# Patient Record
Sex: Female | Born: 2000 | Hispanic: No | Marital: Single | Smoking: Never smoker
Health system: Southern US, Community
[De-identification: ages and names within clinical notes are randomized; demographics above are authoritative.]

## PROBLEM LIST (undated history)

## (undated) DIAGNOSIS — J45909 Unspecified asthma, uncomplicated: Secondary | ICD-10-CM

## (undated) DIAGNOSIS — F909 Attention-deficit hyperactivity disorder, unspecified type: Secondary | ICD-10-CM

## (undated) DIAGNOSIS — R739 Hyperglycemia, unspecified: Secondary | ICD-10-CM

## (undated) HISTORY — DX: Hyperglycemia, unspecified: R73.9

## (undated) HISTORY — PX: WISDOM TOOTH EXTRACTION: SHX21

---

## 2021-12-04 DIAGNOSIS — L749 Eccrine sweat disorder, unspecified: Secondary | ICD-10-CM | POA: Diagnosis not present

## 2021-12-04 DIAGNOSIS — K760 Fatty (change of) liver, not elsewhere classified: Secondary | ICD-10-CM | POA: Diagnosis not present

## 2021-12-04 DIAGNOSIS — R531 Weakness: Secondary | ICD-10-CM | POA: Diagnosis not present

## 2021-12-04 DIAGNOSIS — R197 Diarrhea, unspecified: Secondary | ICD-10-CM | POA: Diagnosis not present

## 2021-12-04 DIAGNOSIS — R3915 Urgency of urination: Secondary | ICD-10-CM | POA: Diagnosis not present

## 2021-12-07 DIAGNOSIS — R002 Palpitations: Secondary | ICD-10-CM | POA: Diagnosis not present

## 2021-12-21 DIAGNOSIS — L509 Urticaria, unspecified: Secondary | ICD-10-CM | POA: Diagnosis not present

## 2022-02-26 DIAGNOSIS — F909 Attention-deficit hyperactivity disorder, unspecified type: Secondary | ICD-10-CM | POA: Diagnosis not present

## 2022-02-26 DIAGNOSIS — R1030 Lower abdominal pain, unspecified: Secondary | ICD-10-CM | POA: Diagnosis not present

## 2022-02-26 DIAGNOSIS — Z789 Other specified health status: Secondary | ICD-10-CM | POA: Diagnosis not present

## 2022-02-26 DIAGNOSIS — R197 Diarrhea, unspecified: Secondary | ICD-10-CM | POA: Diagnosis not present

## 2022-02-26 DIAGNOSIS — K760 Fatty (change of) liver, not elsewhere classified: Secondary | ICD-10-CM | POA: Diagnosis not present

## 2022-04-26 DIAGNOSIS — F4322 Adjustment disorder with anxiety: Secondary | ICD-10-CM | POA: Diagnosis not present

## 2022-05-02 DIAGNOSIS — J387 Other diseases of larynx: Secondary | ICD-10-CM | POA: Diagnosis not present

## 2022-05-02 DIAGNOSIS — L501 Idiopathic urticaria: Secondary | ICD-10-CM | POA: Diagnosis not present

## 2022-05-02 DIAGNOSIS — J309 Allergic rhinitis, unspecified: Secondary | ICD-10-CM | POA: Diagnosis not present

## 2022-05-02 DIAGNOSIS — T781XXA Other adverse food reactions, not elsewhere classified, initial encounter: Secondary | ICD-10-CM | POA: Diagnosis not present

## 2022-05-07 DIAGNOSIS — F4322 Adjustment disorder with anxiety: Secondary | ICD-10-CM | POA: Diagnosis not present

## 2022-05-13 ENCOUNTER — Other Ambulatory Visit: Payer: Self-pay

## 2022-05-13 ENCOUNTER — Emergency Department (HOSPITAL_COMMUNITY): Payer: Medicaid Other

## 2022-05-13 ENCOUNTER — Encounter (HOSPITAL_COMMUNITY): Payer: Self-pay | Admitting: Emergency Medicine

## 2022-05-13 ENCOUNTER — Emergency Department (HOSPITAL_COMMUNITY)
Admission: EM | Admit: 2022-05-13 | Discharge: 2022-05-13 | Disposition: A | Discharge status: Home / Self Care | Payer: Medicaid Other | Attending: Emergency Medicine | Admitting: Emergency Medicine

## 2022-05-13 DIAGNOSIS — R1031 Right lower quadrant pain: Secondary | ICD-10-CM | POA: Diagnosis not present

## 2022-05-13 DIAGNOSIS — R110 Nausea: Secondary | ICD-10-CM | POA: Insufficient documentation

## 2022-05-13 DIAGNOSIS — N9489 Other specified conditions associated with female genital organs and menstrual cycle: Secondary | ICD-10-CM | POA: Diagnosis not present

## 2022-05-13 HISTORY — DX: Attention-deficit hyperactivity disorder, unspecified type: F90.9

## 2022-05-13 LAB — COMPREHENSIVE METABOLIC PANEL
ALT: 14 U/L (ref 0–44)
AST: 18 U/L (ref 15–41)
Albumin: 3.7 g/dL (ref 3.5–5.0)
Alkaline Phosphatase: 34 U/L — ABNORMAL LOW (ref 38–126)
Anion gap: 6 (ref 5–15)
BUN: 9 mg/dL (ref 6–20)
CO2: 23 mmol/L (ref 22–32)
Calcium: 8.8 mg/dL — ABNORMAL LOW (ref 8.9–10.3)
Chloride: 110 mmol/L (ref 98–111)
Creatinine, Ser: 0.74 mg/dL (ref 0.44–1.00)
GFR, Estimated: >60 mL/min (ref >60)
Glucose, Bld: 119 mg/dL — ABNORMAL HIGH (ref 70–99)
Potassium: 4 mmol/L (ref 3.5–5.1)
Sodium: 139 mmol/L (ref 135–145)
Total Bilirubin: 1.1 mg/dL (ref 0.3–1.2)
Total Protein: 6.7 g/dL (ref 6.5–8.1)

## 2022-05-13 LAB — I-STAT BETA HCG BLOOD, ED (MC, WL, AP ONLY): I-stat hCG, quantitative: <5 m[IU]/mL (ref <5)

## 2022-05-13 LAB — CBC
HCT: 40.2 % (ref 36.0–46.0)
Hemoglobin: 14 g/dL (ref 12.0–15.0)
MCH: 26.9 pg (ref 26.0–34.0)
MCHC: 34.8 g/dL (ref 30.0–36.0)
MCV: 77.2 fL — ABNORMAL LOW (ref 80.0–100.0)
Platelets: 208 10*3/uL (ref 150–400)
RBC: 5.21 MIL/uL — ABNORMAL HIGH (ref 3.87–5.11)
RDW: 13 % (ref 11.5–15.5)
WBC: 8.8 10*3/uL (ref 4.0–10.5)
nRBC: 0 % (ref 0.0–0.2)

## 2022-05-13 LAB — LIPASE, BLOOD: Lipase: 22 U/L (ref 11–51)

## 2022-05-13 MED ORDER — MORPHINE SULFATE (PF) 4 MG/ML IV SOLN
4.0000 mg | Freq: Once | INTRAVENOUS | Status: AC
  Administered 2022-05-13: 4 mg via INTRAVENOUS
  Filled 2022-05-13: qty 1

## 2022-05-13 MED ORDER — IOHEXOL 350 MG/ML SOLN
100.0000 mL | Freq: Once | INTRAVENOUS | Status: AC | PRN
  Administered 2022-05-13: 100 mL via INTRAVENOUS

## 2022-05-13 MED ORDER — ONDANSETRON HCL 4 MG/2ML IJ SOLN
4.0000 mg | Freq: Once | INTRAMUSCULAR | Status: AC
Start: 2022-05-13 — End: 2022-05-13
  Administered 2022-05-13: 4 mg via INTRAVENOUS
  Filled 2022-05-13: qty 2

## 2022-05-13 MED ORDER — SODIUM CHLORIDE 0.9 % IV BOLUS
1000.0000 mL | Freq: Once | INTRAVENOUS | Status: AC
  Administered 2022-05-13: 1000 mL via INTRAVENOUS

## 2022-05-13 NOTE — ED Provider Notes (Signed)
[facility] EMERGENCY DEPARTMENT Provider Note   CSN: [id number] Arrival date & time: 05/13/22  1319     History  Chief Complaint  Patient presents with   Abdominal Pain    Stephanie Huff is a 21 y.o. female.  21 year old female with past medical history of ADHD presents with complaint of periumbilical abdominal pain which radiates towards her right lower quadrant associated with nausea.  Patient reports periumbilical abdominal pain yesterday but did not think much of it until today after eating lunch she was laughing and developed sudden significant pain onset 20 minutes prior to arrival.  Denies changes in bowel or bladder habits, fevers, chills, changes in appetite.  No prior abdominal surgeries.  No other complaints or concerns.      Home Medications Prior to Admission medications   Not on File      Allergies    Patient has no allergy information on record.    Review of Systems   Review of Systems Negative except as per HPI Physical Exam Updated Vital Signs BP 132/87 (BP Location: Left Arm)   Pulse 96   Temp 98 F (36.7 C) (Oral)   Resp 20   LMP 04/25/2022   SpO2 100%  Physical Exam Vitals and nursing note reviewed.  Constitutional:      General: She is not in acute distress.    Appearance: She is well-developed. She is obese. She is not diaphoretic.  HENT:     Head: Normocephalic and atraumatic.  Cardiovascular:     Rate and Rhythm: Normal rate and regular rhythm.     Heart sounds: Normal heart sounds.  Pulmonary:     Effort: Pulmonary effort is normal.     Breath sounds: Normal breath sounds.  Abdominal:     Palpations: Abdomen is soft.     Tenderness: There is abdominal tenderness. There is guarding. There is no rebound.  Skin:    General: Skin is warm and dry.     Findings: No erythema or rash.  Neurological:     Mental Status: She is alert and oriented to person, place, and time.  Psychiatric:        Behavior: Behavior  normal.    ED Results / Procedures / Treatments   Labs (all labs ordered are listed, but only abnormal results are displayed) Labs Reviewed  COMPREHENSIVE METABOLIC PANEL - Abnormal; Notable for the following components:      Result Value   Glucose, Bld 119 (*)    Calcium 8.8 (*)    Alkaline Phosphatase 34 (*)    All other components within normal limits  CBC - Abnormal; Notable for the following components:   RBC 5.21 (*)    MCV 77.2 (*)    All other components within normal limits  LIPASE, BLOOD  URINALYSIS, ROUTINE W REFLEX MICROSCOPIC  I-STAT BETA HCG BLOOD, ED ([facility], [facility], [facility] ONLY)    EKG None  Radiology CT Abdomen Pelvis W Contrast  Result Date: 05/13/2022 CLINICAL DATA:  Right lower quadrant abdominal pain EXAM: CT ABDOMEN AND PELVIS WITH CONTRAST TECHNIQUE: Multidetector CT imaging of the abdomen and pelvis was performed using the standard protocol following bolus administration of intravenous contrast. RADIATION DOSE REDUCTION: This exam was performed according to the departmental dose-optimization program which includes automated exposure control, adjustment of the mA and/or kV according to patient size and/or use of iterative reconstruction technique. CONTRAST:  110mL OMNIPAQUE IOHEXOL 350 MG/ML SOLN COMPARISON:  None Available. FINDINGS: Lower chest: No acute abnormality.  Hepatobiliary: No solid liver abnormality is seen. No gallstones, gallbladder wall thickening, or biliary dilatation. Pancreas: Unremarkable. No pancreatic ductal dilatation or surrounding inflammatory changes. Spleen: Normal in size without significant abnormality. Adrenals/Urinary Tract: Adrenal glands are unremarkable. Kidneys are normal, without renal calculi, solid lesion, or hydronephrosis. Bladder is unremarkable. Stomach/Bowel: Stomach is within normal limits. Appendix appears normal. No evidence of bowel wall thickening, distention, or inflammatory changes. Vascular/Lymphatic: No significant vascular  findings are present. No enlarged abdominal or pelvic lymph nodes. Reproductive: No mass. Faintly rim hyperdense lesion of the right ovary measuring 1.5 x 1.4 cm (series 3, image 85). Other: No abdominal wall hernia or abnormality. No ascites. Musculoskeletal: No acute or significant osseous findings. IMPRESSION: 1. No definite CT findings of the abdomen or pelvis to explain right lower quadrant pain. 2. Normal appendix. 3. Faintly rim hyperdense lesion of the right ovary measuring 1.5 x 1.4 cm, most likely a corpus luteum or a small hemorrhagic cyst. No follow-up imaging recommended. Note: This recommendation does not apply to premenarchal patients and to those with increased risk (genetic, family history, elevated tumor markers or other high-risk factors) of ovarian cancer. Reference: JACR 2020 Feb; 17(2):248-254 Electronically Signed   By: Delanna Ahmadi M.D.   On: 05/13/2022 15:55    Procedures Procedures    Medications Ordered in ED Medications  sodium chloride 0.9 % bolus 1,000 mL (1,000 mLs Intravenous New Bag/Given 05/13/22 1358)  ondansetron (ZOFRAN) injection 4 mg (4 mg Intravenous Given 05/13/22 1359)  morphine (PF) 4 MG/ML injection 4 mg (4 mg Intravenous Given 05/13/22 1359)  iohexol (OMNIPAQUE) 350 MG/ML injection 100 mL (100 mLs Intravenous Contrast Given 05/13/22 1550)    ED Course/ Medical Decision Making/ A&P                           Medical Decision Making Amount and/or Complexity of Data Reviewed Labs: ordered. Radiology: ordered.  Risk Prescription drug management.   This patient presents to the ED for concern of periumbilical to right lower quadrant pain, this involves an extensive number of treatment options, and is a complaint that carries with it a high risk of complications and morbidity.  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  The pertinent results include: CBC C with normal white blood cell count, not anemic.  His CMP with mildly elevated glucose on this nonfasting sample at 119 with normal renal and hepatic function.  Lipase normal, hCG negative.   Imaging Studies ordered:  I ordered imaging studies including CT abdomen pelvis I independently visualized and interpreted imaging which showed normal appendix, otherwise unremarkable exam I agree with the radiologist interpretation  Problem List / ED Course / Critical interventions / Medication management  21 year old female with periumbilical abdominal pain onset yesterday, continuing today now with right lower quadrant pain, worse after laughing and eating.  She is found to have right lower quadrant and diffuse abdominal tenderness with guarding.  She appears uncomfortable.  Labs were assessed and are generally reassuring.  CT abdomen pelvis was ordered with concern for  appendicitis given her periumbilical abdominal pain onset yesterday which now localizes to the right lower quadrant. I ordered medication including morphine, Zofran for pain Reevaluation of the patient after these medicines showed that the patient improved I have reviewed the patients home medicines and have made adjustments as needed   Social Determinants of Health:  Has PCP for follow up   Test / Admission - Considered:  Recommend repeat exam in 24 hours with PCP if continuing to have abdominal pain.  Discussed presentation early in her pain could limit her findings today.         Final Clinical Impression(s) / ED  Diagnoses Final diagnoses:  Right lower quadrant abdominal pain    Rx / DC Orders ED Discharge Orders     None         Tacy Learn, PA-C 05/13/22 1604    Horton, Alvin Critchley, DO 05/13/22 1814

## 2022-05-13 NOTE — Discharge Instructions (Signed)
Recommend recheck with your primary care provider in the next 24 hours for repeat abdominal exam.  If your symptoms progress, return to the emergency room for further evaluation.

## 2022-05-13 NOTE — ED Triage Notes (Signed)
Pt states she was eating and then started laughing just prior to arrival and had sudden RLQ pain with nausea.

## 2022-05-18 DIAGNOSIS — F4322 Adjustment disorder with anxiety: Secondary | ICD-10-CM | POA: Diagnosis not present

## 2022-06-04 DIAGNOSIS — F900 Attention-deficit hyperactivity disorder, predominantly inattentive type: Secondary | ICD-10-CM | POA: Diagnosis not present

## 2022-06-04 DIAGNOSIS — R195 Other fecal abnormalities: Secondary | ICD-10-CM | POA: Diagnosis not present

## 2022-06-04 DIAGNOSIS — R197 Diarrhea, unspecified: Secondary | ICD-10-CM | POA: Diagnosis not present

## 2022-06-04 DIAGNOSIS — R202 Paresthesia of skin: Secondary | ICD-10-CM | POA: Diagnosis not present

## 2022-06-04 DIAGNOSIS — G514 Facial myokymia: Secondary | ICD-10-CM | POA: Diagnosis not present

## 2022-06-04 DIAGNOSIS — R1031 Right lower quadrant pain: Secondary | ICD-10-CM | POA: Diagnosis not present

## 2022-06-06 DIAGNOSIS — F4322 Adjustment disorder with anxiety: Secondary | ICD-10-CM | POA: Diagnosis not present

## 2022-06-08 DIAGNOSIS — H5213 Myopia, bilateral: Secondary | ICD-10-CM | POA: Diagnosis not present

## 2022-06-14 DIAGNOSIS — H5213 Myopia, bilateral: Secondary | ICD-10-CM | POA: Diagnosis not present

## 2022-07-10 DIAGNOSIS — F1210 Cannabis abuse, uncomplicated: Secondary | ICD-10-CM | POA: Diagnosis not present

## 2022-07-20 DIAGNOSIS — F4322 Adjustment disorder with anxiety: Secondary | ICD-10-CM | POA: Diagnosis not present

## 2022-08-01 DIAGNOSIS — G479 Sleep disorder, unspecified: Secondary | ICD-10-CM | POA: Diagnosis not present

## 2022-08-01 DIAGNOSIS — G514 Facial myokymia: Secondary | ICD-10-CM | POA: Diagnosis not present

## 2022-08-01 DIAGNOSIS — R202 Paresthesia of skin: Secondary | ICD-10-CM | POA: Diagnosis not present

## 2022-08-10 DIAGNOSIS — G514 Facial myokymia: Secondary | ICD-10-CM | POA: Diagnosis not present

## 2022-08-10 DIAGNOSIS — A045 Campylobacter enteritis: Secondary | ICD-10-CM | POA: Diagnosis not present

## 2022-08-10 DIAGNOSIS — R1031 Right lower quadrant pain: Secondary | ICD-10-CM | POA: Diagnosis not present

## 2022-08-10 DIAGNOSIS — D1723 Benign lipomatous neoplasm of skin and subcutaneous tissue of right leg: Secondary | ICD-10-CM | POA: Diagnosis not present

## 2022-08-17 DIAGNOSIS — F4322 Adjustment disorder with anxiety: Secondary | ICD-10-CM | POA: Diagnosis not present

## 2022-08-20 ENCOUNTER — Telehealth: Payer: Medicaid Other | Admitting: Physician Assistant

## 2022-08-20 DIAGNOSIS — R197 Diarrhea, unspecified: Secondary | ICD-10-CM

## 2022-08-20 MED ORDER — ONDANSETRON HCL 4 MG PO TABS: 4.0000 mg | ORAL_TABLET | Freq: Three times a day (TID) | ORAL | 0 refills | Status: DC | PRN

## 2022-08-20 MED ORDER — CIPROFLOXACIN HCL 500 MG PO TABS: 500.0000 mg | ORAL_TABLET | Freq: Two times a day (BID) | ORAL | 0 refills | Status: AC

## 2022-08-20 NOTE — Progress Notes (Signed)
We are sorry that you are not feeling well.  Here is how we plan to help!  Based on what you have shared with me it looks like you have Acute Infectious Diarrhea.  Most cases of acute diarrhea are due to infections with virus and bacteria and are self-limited conditions lasting less than 14 days.  For your symptoms you may take Imodium 2 mg tablets that are over the counter at your local pharmacy. Take two tablet now and then one after each loose stool up to 6 a day.  Antibiotics are not needed for most people with diarrhea.  Optional: Zofran 4 mg 1 tablet every 8 hours as needed for nausea and vomiting  Optional: I have prescribed Cipro 500 mg twice a day for seven days  HOME CARE We recommend changing your diet to help with your symptoms for the next few days. Drink plenty of fluids that contain water salt and sugar. Sports drinks such as Gatorade may help.  You may try broths, soups, bananas, applesauce, soft breads, mashed potatoes or crackers.  You are considered infectious for as long as the diarrhea continues. Hand washing or use of alcohol based hand sanitizers is recommend. It is best to stay out of work or school until your symptoms stop.   GET HELP RIGHT AWAY If you have dark yellow colored urine or do not pass urine frequently you should drink more fluids.   If your symptoms worsen  If you feel like you are going to pass out (faint) You have a new problem  MAKE SURE YOU  Understand these instructions. Will watch your condition. Will get help right away if you are not doing well or get worse.  Thank you for choosing an e-visit.  Your e-visit answers were reviewed by a board certified advanced clinical practitioner to complete your personal care plan. Depending upon the condition, your plan could have included both over the counter or prescription medications.  Please review your pharmacy choice. Make sure the pharmacy is open so you can pick up prescription now. If there  is a problem, you may contact your provider through MyChart messaging and have the prescription routed to another pharmacy.  Your safety is important to us. If you have drug allergies check your prescription carefully.   For the next 24 hours you can use MyChart to ask questions about today's visit, request a non-urgent call back, or ask for a work or school excuse. You will get an email in the next two days asking about your experience. I hope that your e-visit has been valuable and will speed your recovery.  I provided 5 minutes of non face-to-face time during this encounter for chart review and documentation.   

## 2022-08-28 DIAGNOSIS — R062 Wheezing: Secondary | ICD-10-CM | POA: Diagnosis not present

## 2022-09-06 ENCOUNTER — Telehealth: Payer: Medicaid Other | Admitting: Physician Assistant

## 2022-09-06 DIAGNOSIS — J069 Acute upper respiratory infection, unspecified: Secondary | ICD-10-CM | POA: Diagnosis not present

## 2022-09-06 MED ORDER — FLUTICASONE PROPIONATE 50 MCG/ACT NA SUSP: 2.0000 | Freq: Every day | NASAL | 0 refills | Status: DC

## 2022-09-06 MED ORDER — BENZONATATE 100 MG PO CAPS: 100.0000 mg | ORAL_CAPSULE | Freq: Three times a day (TID) | ORAL | 0 refills | Status: DC | PRN

## 2022-09-06 NOTE — Progress Notes (Signed)

## 2022-09-19 DIAGNOSIS — F4322 Adjustment disorder with anxiety: Secondary | ICD-10-CM | POA: Diagnosis not present

## 2022-09-25 DIAGNOSIS — K219 Gastro-esophageal reflux disease without esophagitis: Secondary | ICD-10-CM | POA: Diagnosis not present

## 2022-09-25 DIAGNOSIS — F419 Anxiety disorder, unspecified: Secondary | ICD-10-CM | POA: Diagnosis not present

## 2022-09-25 DIAGNOSIS — F3281 Premenstrual dysphoric disorder: Secondary | ICD-10-CM | POA: Diagnosis not present

## 2022-09-25 DIAGNOSIS — N83201 Unspecified ovarian cyst, right side: Secondary | ICD-10-CM | POA: Diagnosis not present

## 2022-09-25 DIAGNOSIS — F1210 Cannabis abuse, uncomplicated: Secondary | ICD-10-CM | POA: Diagnosis not present

## 2022-09-25 DIAGNOSIS — R109 Unspecified abdominal pain: Secondary | ICD-10-CM | POA: Diagnosis not present

## 2022-09-25 DIAGNOSIS — R195 Other fecal abnormalities: Secondary | ICD-10-CM | POA: Diagnosis not present

## 2022-10-05 DIAGNOSIS — F1210 Cannabis abuse, uncomplicated: Secondary | ICD-10-CM | POA: Diagnosis not present

## 2022-10-09 DIAGNOSIS — N83201 Unspecified ovarian cyst, right side: Secondary | ICD-10-CM | POA: Diagnosis not present

## 2022-10-09 DIAGNOSIS — R1084 Generalized abdominal pain: Secondary | ICD-10-CM | POA: Diagnosis not present

## 2022-10-09 DIAGNOSIS — R195 Other fecal abnormalities: Secondary | ICD-10-CM | POA: Diagnosis not present

## 2022-10-10 ENCOUNTER — Encounter: Payer: Self-pay | Admitting: Emergency Medicine

## 2022-10-10 ENCOUNTER — Ambulatory Visit
Admission: EM | Admit: 2022-10-10 | Discharge: 2022-10-10 | Disposition: A | Discharge status: Home / Self Care | Payer: Medicaid Other | Attending: Family Medicine | Admitting: Family Medicine

## 2022-10-10 ENCOUNTER — Ambulatory Visit (INDEPENDENT_AMBULATORY_CARE_PROVIDER_SITE_OTHER): Payer: Medicaid Other

## 2022-10-10 DIAGNOSIS — S9031XA Contusion of right foot, initial encounter: Secondary | ICD-10-CM | POA: Diagnosis not present

## 2022-10-10 DIAGNOSIS — M79671 Pain in right foot: Secondary | ICD-10-CM

## 2022-10-10 HISTORY — DX: Unspecified asthma, uncomplicated: J45.909

## 2022-10-10 NOTE — ED Provider Notes (Signed)
[facility]    CSN: [id number] Arrival date & time: 10/10/22  1829      History   Chief Complaint Chief Complaint  Patient presents with   Foot Injury    Entered by patient    HPI Stephanie Huff is a 21 y.o. female.   Can fell from counter on right little toe today.      Past Medical History:  Diagnosis Date   ADHD    Asthma     There are no problems to display for this patient.   History reviewed. No pertinent surgical history.  OB History   No obstetric history on file.      Home Medications    Prior to Admission medications   Medication Sig Start Date End Date Taking? Authorizing Provider  benzonatate (TESSALON) 100 MG capsule Take 1 capsule (100 mg total) by mouth 3 (three) times daily as needed for cough. 09/06/22   Waldon Merl, PA-C  fluticasone (FLONASE) 50 MCG/ACT nasal spray Place 2 sprays into both nostrils daily. 09/06/22   Waldon Merl, PA-C  ondansetron (ZOFRAN) 4 MG tablet Take 1 tablet (4 mg total) by mouth every 8 (eight) hours as needed for nausea or vomiting. 08/20/22   Margaretann Loveless, PA-C    Family History History reviewed. No pertinent family history.  Social History Social History   Tobacco Use   Smoking status: Never   Smokeless tobacco: Never  Substance Use Topics   Alcohol use: Not Currently   Drug use: Not Currently     Allergies   Patient has no known allergies.   Review of Systems Review of Systems PER HPI  Physical Exam Triage Vital Signs ED Triage Vitals  Enc Vitals Group     BP 10/10/22 1836 122/86     Pulse Rate 10/10/22 1836 95     Resp 10/10/22 1836 18     Temp 10/10/22 1836 98.1 F (36.7 C)     Temp Source 10/10/22 1836 Oral     SpO2 10/10/22 1836 98 %     Weight --      Height --      Head Circumference --      Peak Flow --      Pain Score 10/10/22 1837 7     Pain Loc --      Pain Edu? --      Excl. in GC? --    No data found.  Updated Vital Signs BP 122/86  (BP Location: Right Arm)   Pulse 95   Temp 98.1 F (36.7 C) (Oral)   Resp 18   LMP 10/05/2022 (Exact Date)   SpO2 98%   Visual Acuity Right Eye Distance:   Left Eye Distance:   Bilateral Distance:    Right Eye Near:   Left Eye Near:    Bilateral Near:     Physical Exam Vitals and nursing note reviewed.  Constitutional:      Appearance: Normal appearance. She is not ill-appearing.  HENT:     Head: Atraumatic.     Mouth/Throat:     Mouth: Mucous membranes are moist.  Eyes:     Extraocular Movements: Extraocular movements intact.     Conjunctiva/sclera: Conjunctivae normal.  Cardiovascular:     Rate and Rhythm: Normal rate.  Pulmonary:     Effort: Pulmonary effort is normal.  Musculoskeletal:        General: Swelling, tenderness and signs of injury present. No deformity. Normal range of  motion.     Cervical back: Normal range of motion and neck supple.  Skin:    General: Skin is warm and dry.     Findings: Erythema present.     Comments: Small area of erythema, edema at base of 5th toe right foot. Ttp in this area  Neurological:     Mental Status: She is alert and oriented to person, place, and time.     Comments: Right foot neurovascularly intact  Psychiatric:        Mood and Affect: Mood normal.        Thought Content: Thought content normal.        Judgment: Judgment normal.    UC Treatments / Results  Labs (all labs ordered are listed, but only abnormal results are displayed) Labs Reviewed - No data to display  EKG   Radiology DG Foot Complete Right  Result Date: 10/10/2022 CLINICAL DATA:  Can fell on right foot today. Third through fifth toe pain. EXAM: RIGHT FOOT COMPLETE - 3+ VIEW COMPARISON:  None Available. FINDINGS: Normal bone mineralization. Mild hallux valgus. No acute fracture is seen. No dislocation. IMPRESSION: Mild hallux valgus. No acute fracture. Electronically Signed   By: Yvonne Kendall M.D.   On: 10/10/2022 18:48     Procedures Procedures (including critical care time)  Medications Ordered in UC Medications - No data to display  Initial Impression / Assessment and Plan / UC Course  I have reviewed the triage vital signs and the nursing notes.  Pertinent labs & imaging results that were available during my care of the patient were reviewed by me and considered in my medical decision making (see chart for details).     X-ray of the right foot today showing no fractures or other acute abnormalities.  Discussed RICE protocol, over-the-counter pain relievers.  Patient requested work accommodations for the next 2 shifts which were provided in a note.  Final Clinical Impressions(s) / UC Diagnoses   Final diagnoses:  Contusion of right foot, initial encounter     Discharge Instructions      Your x-ray today did not show anything to be broken in your foot.  Make sure to use ice, elevate the leg at rest, take ibuprofen and Tylenol as needed for pain.    ED Prescriptions   None    PDMP not reviewed this encounter.   Volney American, [place] 10/10/22 1857

## 2022-10-10 NOTE — ED Triage Notes (Signed)
Can fell from counter on right little toe today.

## 2022-10-10 NOTE — Discharge Instructions (Signed)
Your x-ray today did not show anything to be broken in your foot.  Make sure to use ice, elevate the leg at rest, take ibuprofen and Tylenol as needed for pain.

## 2022-11-13 DIAGNOSIS — R202 Paresthesia of skin: Secondary | ICD-10-CM | POA: Diagnosis not present

## 2022-11-13 DIAGNOSIS — R29818 Other symptoms and signs involving the nervous system: Secondary | ICD-10-CM | POA: Diagnosis not present

## 2022-11-13 DIAGNOSIS — G478 Other sleep disorders: Secondary | ICD-10-CM | POA: Diagnosis not present

## 2022-11-13 DIAGNOSIS — G4763 Sleep related bruxism: Secondary | ICD-10-CM | POA: Diagnosis not present

## 2022-11-13 DIAGNOSIS — G4750 Parasomnia, unspecified: Secondary | ICD-10-CM | POA: Diagnosis not present

## 2022-11-13 DIAGNOSIS — F513 Sleepwalking [somnambulism]: Secondary | ICD-10-CM | POA: Diagnosis not present

## 2022-11-13 DIAGNOSIS — F458 Other somatoform disorders: Secondary | ICD-10-CM | POA: Diagnosis not present

## 2022-11-13 DIAGNOSIS — G4759 Other parasomnia: Secondary | ICD-10-CM | POA: Diagnosis not present

## 2022-11-23 ENCOUNTER — Ambulatory Visit
Admission: EM | Admit: 2022-11-23 | Discharge: 2022-11-23 | Disposition: A | Discharge status: Home / Self Care | Payer: Medicaid Other

## 2022-11-23 DIAGNOSIS — S91311A Laceration without foreign body, right foot, initial encounter: Secondary | ICD-10-CM | POA: Diagnosis not present

## 2022-11-23 MED ORDER — MUPIROCIN 2 % EX OINT: 1.0000 | TOPICAL_OINTMENT | Freq: Two times a day (BID) | CUTANEOUS | 0 refills | Status: DC

## 2022-11-23 MED ORDER — CHLORHEXIDINE GLUCONATE 4 % EX LIQD
Freq: Every day | CUTANEOUS | 0 refills | Status: DC | PRN
Start: 2022-11-23 — End: 2023-08-05

## 2022-11-23 NOTE — ED Triage Notes (Signed)
Pt reports laceration in the right foot with a metal part of a recliner last night.   Pt think she had the Tdap when she was 21 years old.

## 2022-11-23 NOTE — ED Provider Notes (Signed)
[facility]    CSN: [id number] Arrival date & time: 11/23/22  1018      History   Chief Complaint Chief Complaint  Patient presents with   Laceration    HPI Stephanie Huff is a 21 y.o. female.   Pt reports laceration in the right foot with a metal part of a recliner last night.    Pt think she had the Tdap when she was 21 years old.        Past Medical History:  Diagnosis Date   ADHD    Asthma     There are no problems to display for this patient.   History reviewed. No pertinent surgical history.  OB History   No obstetric history on file.      Home Medications    Prior to Admission medications   Medication Sig Start Date End Date Taking? Authorizing Provider  amphetamine-dextroamphetamine (ADDERALL XR) 30 MG 24 hr capsule Take by mouth. 11/01/22  Yes [provider]  chlorhexidine (HIBICLENS) 4 % external liquid Apply topically daily as needed. 11/23/22  Yes Particia Nearing, PA-C  mupirocin ointment (BACTROBAN) 2 % Apply 1 Application topically 2 (two) times daily. 11/23/22  Yes Particia Nearing, PA-C  benzonatate (TESSALON) 100 MG capsule Take 1 capsule (100 mg total) by mouth 3 (three) times daily as needed for cough. 09/06/22   Waldon Merl, PA-C  fluticasone (FLONASE) 50 MCG/ACT nasal spray Place 2 sprays into both nostrils daily. 09/06/22   Waldon Merl, PA-C  ondansetron (ZOFRAN) 4 MG tablet Take 1 tablet (4 mg total) by mouth every 8 (eight) hours as needed for nausea or vomiting. 08/20/22   Margaretann Loveless, PA-C    Family History History reviewed. No pertinent family history.  Social History Social History   Tobacco Use   Smoking status: Never   Smokeless tobacco: Never  Substance Use Topics   Alcohol use: Not Currently   Drug use: Not Currently   Allergies   Shellfish allergy and Black walnut flavor  Review of Systems Review of Systems PER HPI  Physical Exam Triage Vital Signs ED  Triage Vitals  Enc Vitals Group     BP 11/23/22 1110 (!) 127/93     Pulse Rate 11/23/22 1110 83     Resp 11/23/22 1110 16     Temp 11/23/22 1110 98 F (36.7 C)     Temp Source 11/23/22 1110 Oral     SpO2 11/23/22 1110 97 %     Weight --      Height --      Head Circumference --      Peak Flow --      Pain Score 11/23/22 1109 8     Pain Loc --      Pain Edu? --      Excl. in GC? --    No data found.  Updated Vital Signs BP (!) 127/93 (BP Location: Right Arm)   Pulse 83   Temp 98 F (36.7 C) (Oral)   Resp 16   LMP 11/01/2022 (Exact Date)   SpO2 97%   Visual Acuity Right Eye Distance:   Left Eye Distance:   Bilateral Distance:    Right Eye Near:   Left Eye Near:    Bilateral Near:     Physical Exam Vitals and nursing note reviewed.  Constitutional:      Appearance: Normal appearance. She is not ill-appearing.  HENT:     Head: Atraumatic.  Eyes:     Extraocular Movements: Extraocular movements intact.     Conjunctiva/sclera: Conjunctivae normal.  Cardiovascular:     Rate and Rhythm: Normal rate and regular rhythm.     Heart sounds: Normal heart sounds.  Pulmonary:     Effort: Pulmonary effort is normal.     Breath sounds: Normal breath sounds.  Musculoskeletal:        General: Tenderness and signs of injury present. No deformity. Normal range of motion.     Cervical back: Normal range of motion and neck supple.  Skin:    General: Skin is warm.     Comments: Well-approximated laceration/puncture type wound to the plantar surface of the right heel, no active bleeding or drainage, minimally tender to palpation.  No foreign body noted on exploration of wound  Neurological:     Mental Status: She is alert and oriented to person, place, and time.  Psychiatric:        Mood and Affect: Mood normal.        Thought Content: Thought content normal.        Judgment: Judgment normal.    UC Treatments / Results  Labs (all labs ordered are listed, but only abnormal  results are displayed) Labs Reviewed - No data to display  EKG  Radiology No results found.  Procedures Procedures (including critical care time)  Medications Ordered in UC Medications - No data to display  Initial Impression / Assessment and Plan / UC Course  I have reviewed the triage vital signs and the nursing notes.  Pertinent labs & imaging results that were available during my care of the patient were reviewed by me and considered in my medical decision making (see chart for details).     Wound cleaned, explored and no foreign bodies noted.  She states she is up-to-date on her tetanus shot as of 4 years ago.  New dressing applied, discussed home wound care with Hibiclens, mupirocin ointment and RICE protocol.  Light duty work note given per her request.  Return for worsening symptoms.  Final Clinical Impressions(s) / UC Diagnoses   Final diagnoses:  Laceration of right foot, initial encounter   Discharge Instructions   None    ED Prescriptions     Medication Sig Dispense Auth. Provider   chlorhexidine (HIBICLENS) 4 % external liquid Apply topically daily as needed. 120 mL Particia Nearing, PA-C   mupirocin ointment (BACTROBAN) 2 % Apply 1 Application topically 2 (two) times daily. 22 g Particia Nearing, [place]      PDMP not reviewed this encounter.   Particia Nearing, [place] 11/23/22 1206

## 2022-11-23 NOTE — ED Notes (Signed)
Called phone ad lobby, no answered.

## 2022-11-24 IMAGING — CT CT ABD-PELV W/ CM
2 of 4 series · 16 of 46 positions shown, 18 images · IV contrast (Omni 300)
Comparison: None Available.

CLINICAL DATA: Right lower quadrant abdominal pain

EXAM:
CT ABDOMEN AND PELVIS WITH CONTRAST
TECHNIQUE: Multidetector CT imaging of the abdomen and pelvis was performed
using the standard protocol following bolus administration of
intravenous contrast.

[Series 3: a/p w/ 5mm · axial · 0.98mm/px · z∈[+776,+1242]mm · 13 of 103 slices shown, 15 images]
[im 5/103  soft-tissue]
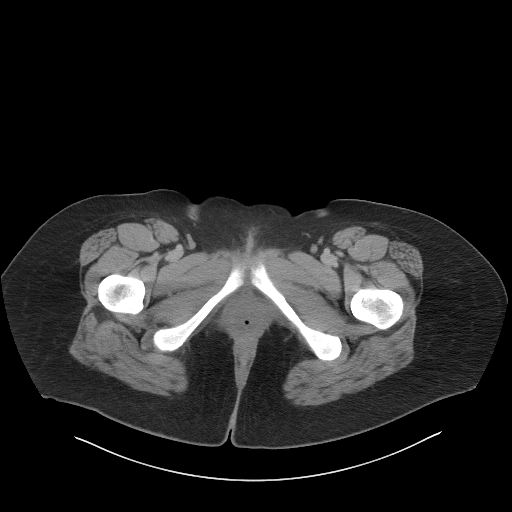
[im 5/103  bone]
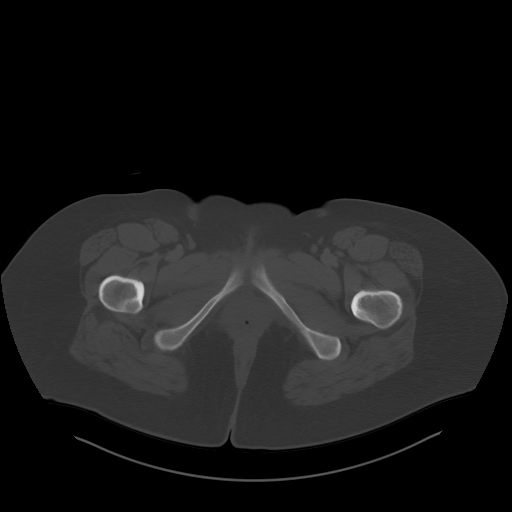
[im 13/103  soft-tissue]
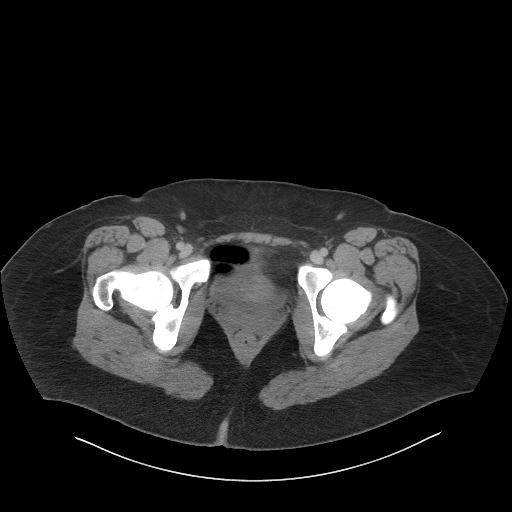
[im 22/103  soft-tissue]
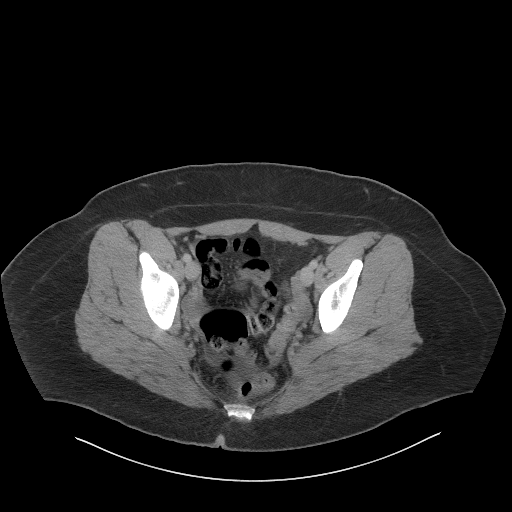
[im 30/103  soft-tissue]
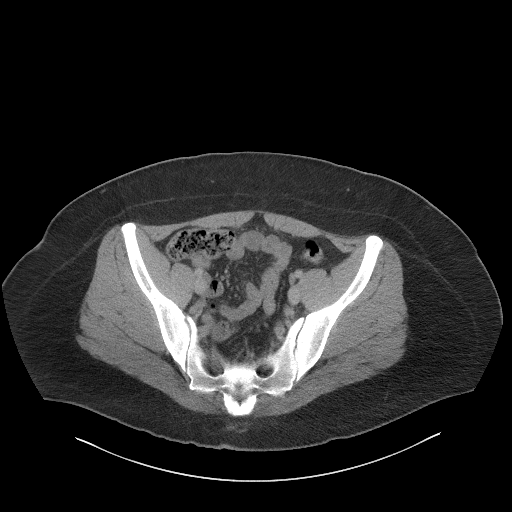
[im 35/103  soft-tissue]
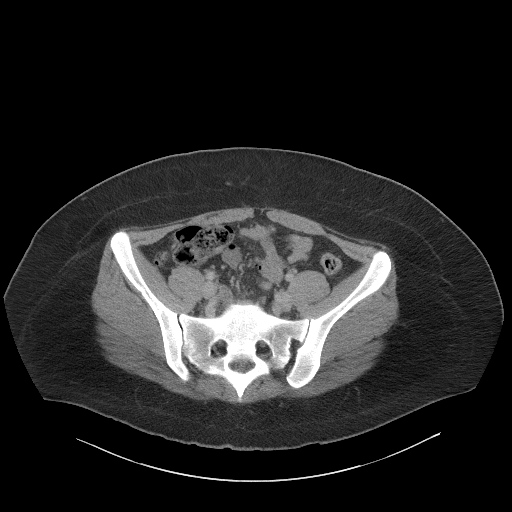
[im 43/103  soft-tissue]
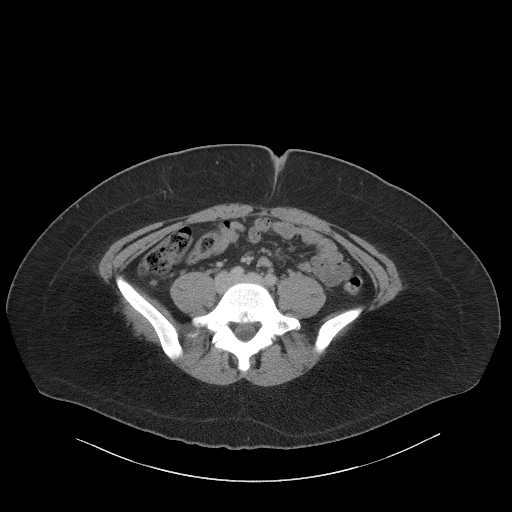
[im 52/103  soft-tissue]
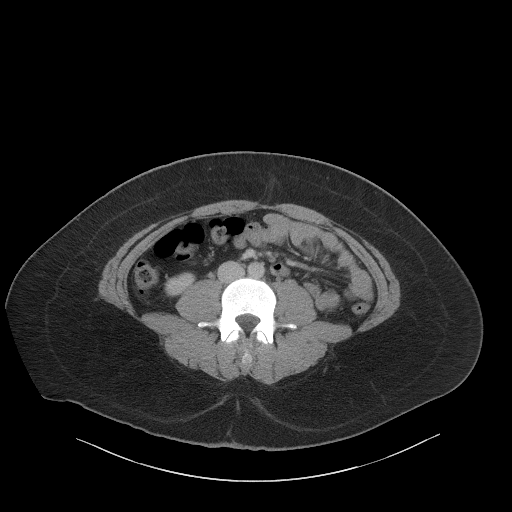
[im 60/103  soft-tissue]
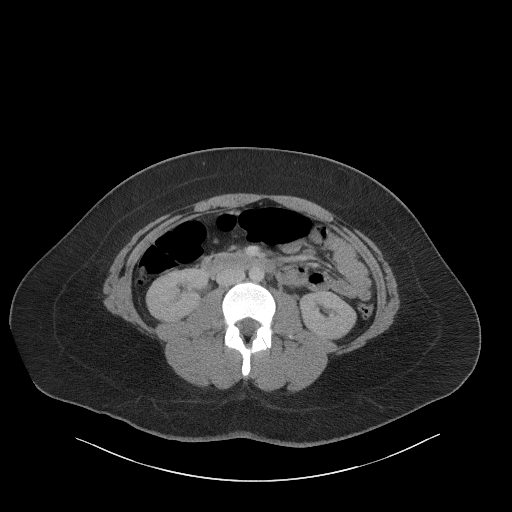
[im 69/103  soft-tissue]
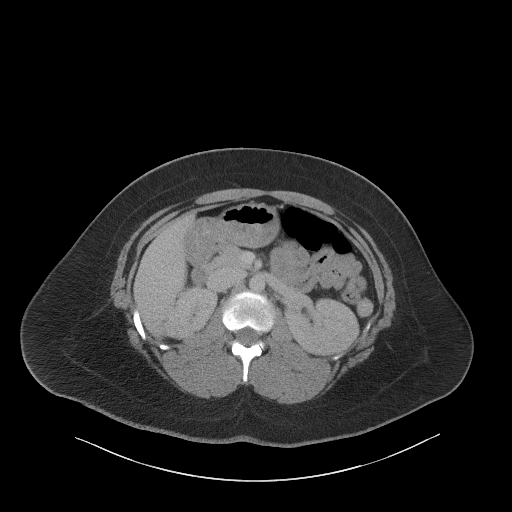
[im 69/103  bone]
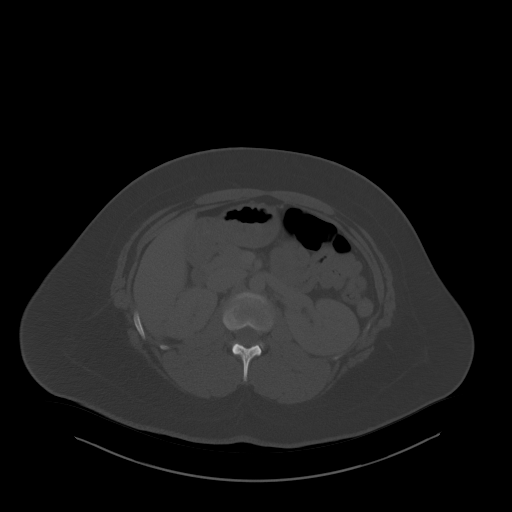
[im 73/103  soft-tissue]
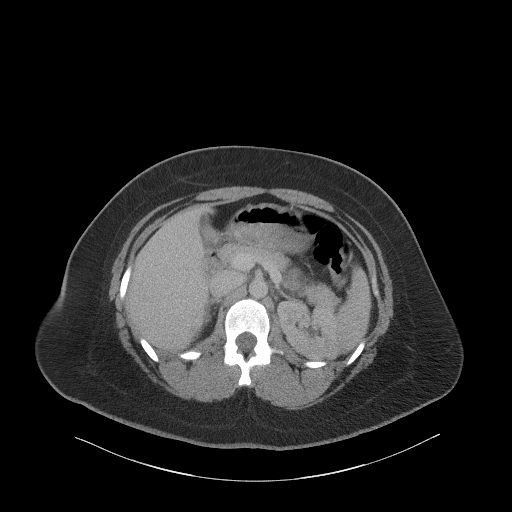
[im 81/103  soft-tissue]
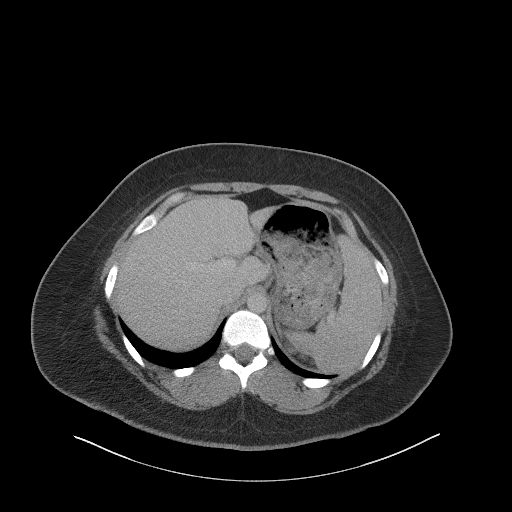
[im 90/103  soft-tissue]
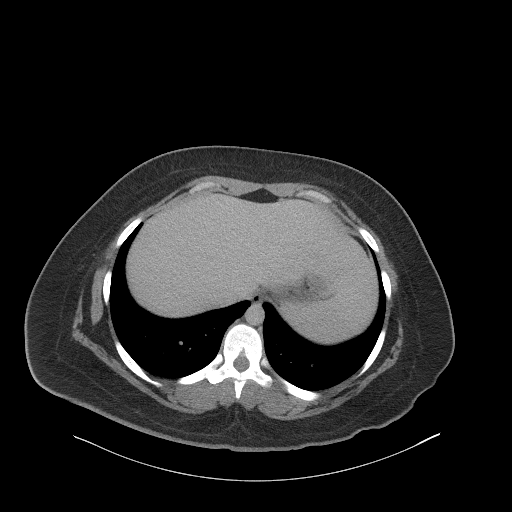
[im 98/103  soft-tissue]
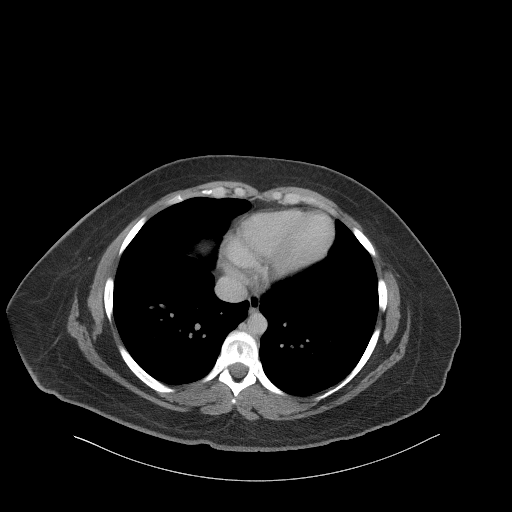

[Series 6: a/p w/ cor · coronal · 0.92mm/px · 3 of 149 slices shown]
[im 50/149  soft-tissue]
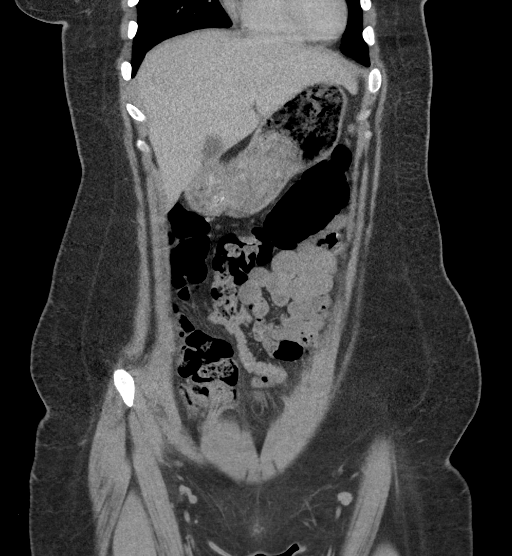
[im 66/149  soft-tissue]
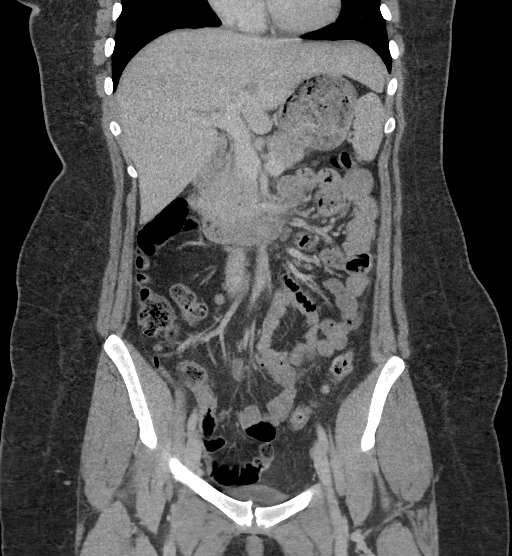
[im 83/149  soft-tissue]
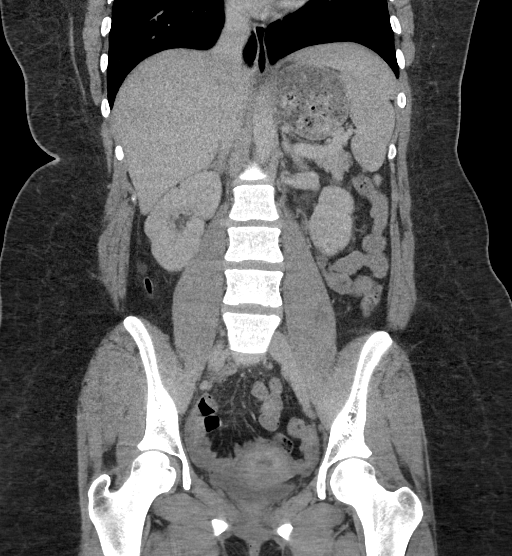

[16 of 46 positions shown; findings below may reference images not displayed]

RADIATION DOSE REDUCTION: This exam was performed according to the
departmental dose-optimization program which includes automated
exposure control, adjustment of the mA and/or kV according to
patient size and/or use of iterative reconstruction technique.

CONTRAST:  100mL OMNIPAQUE IOHEXOL 350 MG/ML SOLN
FINDINGS: Lower chest: No acute abnormality.

Hepatobiliary: No solid liver abnormality is seen. No gallstones,
gallbladder wall thickening, or biliary dilatation.

Pancreas: Unremarkable. No pancreatic ductal dilatation or
surrounding inflammatory changes.

Spleen: Normal in size without significant abnormality.

Adrenals/Urinary Tract: Adrenal glands are unremarkable. Kidneys are
normal, without renal calculi, solid lesion, or hydronephrosis.
Bladder is unremarkable.

Stomach/Bowel: Stomach is within normal limits. Appendix appears
normal. No evidence of bowel wall thickening, distention, or
inflammatory changes.

Vascular/Lymphatic: No significant vascular findings are present. No
enlarged abdominal or pelvic lymph nodes.

Reproductive: No mass. Faintly rim hyperdense lesion of the right
ovary measuring 1.5 x 1.4 cm (series 3, image 85).

Other: No abdominal wall hernia or abnormality. No ascites.

Musculoskeletal: No acute or significant osseous findings.
IMPRESSION: 1. No definite CT findings of the abdomen or pelvis to explain right
lower quadrant pain.
2. Normal appendix.
3. Faintly rim hyperdense lesion of the right ovary measuring 1.5 x
1.4 cm, most likely a corpus luteum or a small hemorrhagic cyst. No
follow-up imaging recommended. Note: This recommendation does not
apply to premenarchal patients and to those with increased risk
(genetic, family history, elevated tumor markers or other high-risk
factors) of ovarian cancer. Reference: JACR [DATE]):[id number]

## 2022-11-29 ENCOUNTER — Telehealth: Payer: Medicaid Other | Admitting: Emergency Medicine

## 2022-11-29 DIAGNOSIS — B349 Viral infection, unspecified: Secondary | ICD-10-CM

## 2022-11-29 NOTE — Progress Notes (Signed)
E visit for Flu like symptoms   We are sorry that you are not feeling well.  Here is how we plan to help! Based on what you have shared with me it looks like you may have a respiratory virus that may be influenza. Your symptoms could also be due to COVID - I advise you to test yourself for covid at home.   Let me know if you need a note for work or school.   Influenza or "the flu" is   an infection caused by a respiratory virus. The flu virus is highly contagious and persons who did not receive their yearly flu vaccination may "catch" the flu from close contact.  We have anti-viral medications to treat the viruses that cause this infection. They are not a "cure" and only shorten the course of the infection. These prescriptions are most effective when they are given within the first 2 days of "flu" symptoms. Antiviral medication are indicated if you have a high risk of complications from the flu. You should  also consider an antiviral medication if you are in close contact with someone who is at risk. These medications can help patients avoid complications from the flu  but have side effects that you should know. Possible side effects from Tamiflu or oseltamivir include nausea, vomiting, diarrhea, dizziness, headaches, eye redness, sleep problems or other respiratory symptoms. You should not take Tamiflu if you have an allergy to oseltamivir or any to the ingredients in Tamiflu.  Based upon your symptoms and how long you have been sick and potential risk factors I recommend that you follow the flu symptoms recommendation that I have listed below.  ANYONE WHO HAS FLU SYMPTOMS SHOULD: Stay home. The flu is highly contagious and going out or to work exposes others! Be sure to drink plenty of fluids. Water is fine as well as fruit juices, sodas and electrolyte beverages. You may want to stay away from caffeine or alcohol. If you are nauseated, try taking small sips of liquids. How do you know if you are  getting enough fluid? Your urine should be a pale yellow or almost colorless. Get rest. Taking a steamy shower or using a humidifier may help nasal congestion and ease sore throat pain. Using a saline nasal spray works much the same way. Cough drops, hard candies and sore throat lozenges may ease your cough. Line up a caregiver. Have someone check on you regularly.   GET HELP RIGHT AWAY IF: You cannot keep down liquids or your medications. You become short of breath Your fell like you are going to pass out or loose consciousness. Your symptoms persist after you have completed your treatment plan MAKE SURE YOU  Understand these instructions. Will watch your condition. Will get help right away if you are not doing well or get worse.  Your e-visit answers were reviewed by a board certified advanced clinical practitioner to complete your personal care plan.  Depending on the condition, your plan could have included both over the counter or prescription medications.  If there is a problem please reply  once you have received a response from your provider.  Your safety is important to [place].  If you have drug allergies check your prescription carefully.    You can use MyChart to ask questions about today's visit, request a non-urgent call back, or ask for a work or school excuse for 24 hours related to this e-Visit. If it has been greater than 24 hours you will need  to follow up with your provider, or enter a new e-Visit to address those concerns.  You will get an e-mail in the next two days asking about your experience.  I hope that your e-visit has been valuable and will speed your recovery. Thank you for using e-visits.  I have spent 5 minutes in review of e-visit questionnaire, review and updating patient chart, medical decision making and response to patient.   Rica Mast, PhD, FNP-BC

## 2023-02-16 ENCOUNTER — Telehealth: Payer: Medicaid Other | Admitting: Family Medicine

## 2023-02-16 DIAGNOSIS — J069 Acute upper respiratory infection, unspecified: Secondary | ICD-10-CM | POA: Diagnosis not present

## 2023-02-16 DIAGNOSIS — J4541 Moderate persistent asthma with (acute) exacerbation: Secondary | ICD-10-CM

## 2023-02-16 MED ORDER — BENZONATATE 100 MG PO CAPS: 100.0000 mg | ORAL_CAPSULE | Freq: Three times a day (TID) | ORAL | 0 refills | Status: DC | PRN

## 2023-02-16 MED ORDER — PREDNISONE 20 MG PO TABS: 20.0000 mg | ORAL_TABLET | Freq: Two times a day (BID) | ORAL | 0 refills | Status: AC

## 2023-02-16 MED ORDER — FLUTICASONE PROPIONATE 50 MCG/ACT NA SUSP: 2.0000 | Freq: Every day | NASAL | 0 refills | Status: DC

## 2023-02-16 NOTE — Progress Notes (Signed)
E-Visit for Upper Respiratory Infection   We are sorry you are not feeling well.  Here is how we plan to help!  Based on what you have shared with me, it looks like you may have a viral upper respiratory infection.  Upper respiratory infections are caused by a large number of viruses; however, rhinovirus is the most common cause.   Symptoms vary from person to person, with common symptoms including sore throat, cough, fatigue or lack of energy and feeling of general discomfort.  A low-grade fever of up to 100.4 may present, but is often uncommon.  Symptoms vary however, and are closely related to a person's age or underlying illnesses.  The most common symptoms associated with an upper respiratory infection are nasal discharge or congestion, cough, sneezing, headache and pressure in the ears and face.  These symptoms usually persist for about 3 to 10 days, but can last up to 2 weeks.  It is important to know that upper respiratory infections do not cause serious illness or complications in most cases.    Upper respiratory infections can be transmitted from person to person, with the most common method of transmission being a person's hands.  The virus is able to live on the skin and can infect other persons for up to 2 hours after direct contact.  Also, these can be transmitted when someone coughs or sneezes; thus, it is important to cover the mouth to reduce this risk.  To keep the spread of the illness at [place], good hand hygiene is very important.  This is an infection that is most likely caused by a virus. There are no specific treatments other than to help you with the symptoms until the infection runs its course.  We are sorry you are not feeling well.  Here is how we plan to help!   For nasal congestion, you may use an oral decongestants such as Mucinex D or if you have glaucoma or high blood pressure use plain Mucinex.  Saline nasal spray or nasal drops can help and can safely be used as often as  needed for congestion.  For your congestion, I have prescribed Fluticasone nasal spray one spray in each nostril twice a day  If you do not have a history of heart disease, hypertension, diabetes or thyroid disease, prostate/bladder issues or glaucoma, you may also use Sudafed to treat nasal congestion.  It is highly recommended that you consult with a pharmacist or your primary care physician to ensure this medication is safe for you to take.     If you have a cough, you may use cough suppressants such as Delsym and Robitussin.  If you have glaucoma or high blood pressure, you can also use Coricidin HBP.   For cough I have prescribed for you A prescription cough medication called Tessalon Perles 100 mg. You may take 1-2 capsules every 8 hours as needed for cough  Prednisone also sent.   If you have a sore or scratchy throat, use a saltwater gargle-  to  teaspoon of salt dissolved in a 4-ounce to 8-ounce glass of warm water.  Gargle the solution for approximately 15-30 seconds and then spit.  It is important not to swallow the solution.  You can also use throat lozenges/cough drops and Chloraseptic spray to help with throat pain or discomfort.  Warm or cold liquids can also be helpful in relieving throat pain.  For headache, pain or general discomfort, you can use Ibuprofen or Tylenol as directed.  Some authorities believe that zinc sprays or the use of Echinacea may shorten the course of your symptoms.   HOME CARE Only take medications as instructed by your medical team. Be sure to drink plenty of fluids. Water is fine as well as fruit juices, sodas and electrolyte beverages. You may want to stay away from caffeine or alcohol. If you are nauseated, try taking small sips of liquids. How do you know if you are getting enough fluid? Your urine should be a pale yellow or almost colorless. Get rest. Taking a steamy shower or using a humidifier may help nasal congestion and ease sore throat pain.  You can place a towel over your head and breathe in the steam from hot water coming from a faucet. Using a saline nasal spray works much the same way. Cough drops, hard candies and sore throat lozenges may ease your cough. Avoid close contacts especially the very young and the elderly Cover your mouth if you cough or sneeze Always remember to wash your hands.   GET HELP RIGHT AWAY IF: You develop worsening fever. If your symptoms do not improve within 10 days You develop yellow or green discharge from your nose over 3 days. You have coughing fits You develop a severe head ache or visual changes. You develop shortness of breath, difficulty breathing or start having chest pain Your symptoms persist after you have completed your treatment plan  MAKE SURE YOU  Understand these instructions. Will watch your condition. Will get help right away if you are not doing well or get worse.  Thank you for choosing an e-visit.  Your e-visit answers were reviewed by a board certified advanced clinical practitioner to complete your personal care plan. Depending upon the condition, your plan could have included both over the counter or prescription medications.  Please review your pharmacy choice. Make sure the pharmacy is open so you can pick up prescription now. If there is a problem, you may contact your provider through [employer] and have the prescription routed to another pharmacy.  Your safety is important to [place]. If you have drug allergies check your prescription carefully.   For the next 24 hours you can use MyChart to ask questions about today's visit, request a non-urgent call back, or ask for a work or school excuse. You will get an email in the next two days asking about your experience. I hope that your e-visit has been valuable and will speed your recovery.   have provided 5 minutes of non face to face time during this encounter for chart review and documentation.

## 2023-03-31 ENCOUNTER — Telehealth: Payer: Medicaid Other | Admitting: Family

## 2023-03-31 DIAGNOSIS — R197 Diarrhea, unspecified: Secondary | ICD-10-CM

## 2023-03-31 DIAGNOSIS — R112 Nausea with vomiting, unspecified: Secondary | ICD-10-CM

## 2023-03-31 MED ORDER — ONDANSETRON HCL 4 MG PO TABS: 4.0000 mg | ORAL_TABLET | Freq: Three times a day (TID) | ORAL | 0 refills | Status: DC | PRN

## 2023-03-31 NOTE — Progress Notes (Signed)
E-Visit for Diarrhea  We are sorry that you are not feeling well.  Here is how we plan to help!  Based on what you have shared with me it looks like you have Acute Infectious Diarrhea.  Most cases of acute diarrhea are due to infections with virus and bacteria and are self-limited conditions lasting less than 14 days.  For your symptoms you may take Imodium 2 mg tablets that are over the counter at your local pharmacy. Take two tablet now and then one after each loose stool up to 6 a day.  Antibiotics are not needed for most people with diarrhea.   Zofran 4 mg 1 tablet every 8 hours as needed for nausea and vomiting  I have sent a work note to Pharmacologist. It will be under letters.   HOME CARE We recommend changing your diet to help with your symptoms for the next few days. Drink plenty of fluids that contain water salt and sugar. Sports drinks such as Gatorade may help.  You may try broths, soups, bananas, applesauce, soft breads, mashed potatoes or crackers.  You are considered infectious for as long as the diarrhea continues. Hand washing or use of alcohol based hand sanitizers is recommend. It is best to stay out of work or school until your symptoms stop.   GET HELP RIGHT AWAY If you have dark yellow colored urine or do not pass urine frequently you should drink more fluids.   If your symptoms worsen  If you feel like you are going to pass out (faint) You have a new problem  MAKE SURE YOU  Understand these instructions. Will watch your condition. Will get help right away if you are not doing well or get worse.  Thank you for choosing an e-visit.  Your e-visit answers were reviewed by a board certified advanced clinical practitioner to complete your personal care plan. Depending upon the condition, your plan could have included both over the counter or prescription medications.  Please review your pharmacy choice. Make sure the pharmacy is open so you can pick up  prescription now. If there is a problem, you may contact your provider through [employer] and have the prescription routed to another pharmacy.  Your safety is important to [place]. If you have drug allergies check your prescription carefully.   For the next 24 hours you can use MyChart to ask questions about today's visit, request a non-urgent call back, or ask for a work or school excuse. You will get an email in the next two days asking about your experience. I hope that your e-visit has been valuable and will speed your recovery.   Approximately 5 minutes was spent documenting and reviewing patient's chart.

## 2023-04-24 ENCOUNTER — Ambulatory Visit: Payer: Self-pay

## 2023-05-21 DIAGNOSIS — J302 Other seasonal allergic rhinitis: Secondary | ICD-10-CM | POA: Diagnosis not present

## 2023-05-21 DIAGNOSIS — F900 Attention-deficit hyperactivity disorder, predominantly inattentive type: Secondary | ICD-10-CM | POA: Diagnosis not present

## 2023-05-21 DIAGNOSIS — R197 Diarrhea, unspecified: Secondary | ICD-10-CM | POA: Diagnosis not present

## 2023-07-12 ENCOUNTER — Telehealth: Payer: Medicaid Other | Admitting: Family Medicine

## 2023-07-12 DIAGNOSIS — L089 Local infection of the skin and subcutaneous tissue, unspecified: Secondary | ICD-10-CM

## 2023-07-12 NOTE — Progress Notes (Signed)
   Thank you for the details you included in the comment boxes. Those details are very helpful in determining the best course of treatment for you and help [place] to provide the best care.Because Ms Jankiewicz, we recommend that you convert this visit to a video visit in order for the provider to better assess what is going on.  The provider will be able to give you a more accurate diagnosis and treatment plan if we can more freely discuss your symptoms and with the addition of a virtual examination.   If you convert to a video visit, we will bill your insurance (similar to an office visit) and you will not be charged for this e-Visit. You will be able to stay at home and speak with the first available [id number] advanced practice provider. The link to do a video visit is in the drop down Menu tab of your Welcome screen in MyChart.

## 2023-08-05 ENCOUNTER — Encounter: Payer: Self-pay | Admitting: Adult Health

## 2023-08-05 ENCOUNTER — Ambulatory Visit (INDEPENDENT_AMBULATORY_CARE_PROVIDER_SITE_OTHER): Payer: Medicaid Other | Admitting: Adult Health

## 2023-08-05 VITALS — BP 124/80 | HR 62 | Ht 68.5 in | Wt 295.5 lb

## 2023-08-05 DIAGNOSIS — N946 Dysmenorrhea, unspecified: Secondary | ICD-10-CM

## 2023-08-05 DIAGNOSIS — E282 Polycystic ovarian syndrome: Secondary | ICD-10-CM | POA: Diagnosis not present

## 2023-08-05 DIAGNOSIS — F32A Depression, unspecified: Secondary | ICD-10-CM

## 2023-08-05 DIAGNOSIS — Z3202 Encounter for pregnancy test, result negative: Secondary | ICD-10-CM | POA: Diagnosis not present

## 2023-08-05 DIAGNOSIS — Z3009 Encounter for other general counseling and advice on contraception: Secondary | ICD-10-CM | POA: Diagnosis not present

## 2023-08-05 DIAGNOSIS — F419 Anxiety disorder, unspecified: Secondary | ICD-10-CM

## 2023-08-05 DIAGNOSIS — Z113 Encounter for screening for infections with a predominantly sexual mode of transmission: Secondary | ICD-10-CM

## 2023-08-05 DIAGNOSIS — Z131 Encounter for screening for diabetes mellitus: Secondary | ICD-10-CM | POA: Diagnosis not present

## 2023-08-05 LAB — POCT URINE PREGNANCY: Preg Test, Ur: NEGATIVE

## 2023-08-05 NOTE — Progress Notes (Signed)
Subjective:     Patient ID: Stephanie Huff, female   DOB: Sep 04, 2001, 22 y.o.   MRN: [id number]  HPI Stephanie Huff is a 22 year old biracial female, single, G0P0, in complaining of period cramps and was told she had ?PCO. Periods have been regular and she has female partner. She wants to discuss birth control options, she is thinking nexplanon. She was offered metformin in the past, but declined.   PCP is Stephanie Bad PA  Review of Systems +period cramps Periods regular Denies MI,stroke, DVT, breast cancer or migraine with Aura  Reviewed past medical,surgical, social and family history. Reviewed medications and allergies.     Objective:   Physical Exam BP 124/80 (BP Location: Left Arm, Patient Position: Sitting, Cuff Size: Normal)   Pulse 62   Ht 5' 8.5" (1.74 m)   Wt 295 lb 8 oz (134 kg)   LMP 08/04/2023   BMI 44.28 kg/m  UPT is negative Skin warm and dry. Neck: mid line trachea, normal thyroid, good ROM, no lymphadenopathy noted. Lungs: clear to ausculation bilaterally. Cardiovascular: regular rate and rhythm.    AA [place] 1 Fall risk is moderate    08/05/2023    1:37 PM  Depression screen PHQ 2/9  Decreased Interest 2  Down, Depressed, Hopeless 2  PHQ - 2 Score 4  Altered sleeping 1  Tired, decreased energy 2  Change in appetite 1  Feeling Huff or failure about yourself  3  Trouble concentrating 2  Moving slowly or fidgety/restless 2  Suicidal thoughts 0  PHQ-9 Score 15       08/05/2023    1:37 PM  GAD 7 : Generalized Anxiety Score  Nervous, Anxious, on Edge 3  Control/stop worrying 2  Worry too much - different things 2  Trouble relaxing 3  Restless 2  Easily annoyed or irritable 3  Afraid - awful might happen 2  Total GAD 7 Score 17      Upstream - 08/05/23 1349       Pregnancy Intention Screening   Does the patient want to become pregnant in the next year? No    Does the patient's partner want to become pregnant in the next year? No    Would the patient  like to discuss contraceptive options today? Yes      Contraception Wrap Up   Current Method No Method - Other Reason    Reason for No Current Contraceptive Method at Intake ([facility] Only) Same Sex Partner    End Method No Method - Other Reason    Contraception Counseling Provided Yes             Assessment:     1. Pregnancy examination or test, negative result - POCT urine pregnancy  2. Screening examination for STD (sexually transmitted disease) GC/CHL sent on urine  - GC/Chlamydia Probe Amp  3. Dysmenorrhea Has period cramps Will get pelvic US to assess uterus and ovaries, scheduled for Stephanie Huff 08/14/23 at 2:30 pm - US PELVIC COMPLETE WITH TRANSVAGINAL; Future  4. Anxiety and depression She declines meds at this time, no SI  5. PCO (polycystic ovaries) ?PCO  Will check labs  Pelvic US scheduled for 08/14/23 at [facility] at 2:30 pm to assess ovaries and uterus  - TSH + free T4 - Insulin, random - US PELVIC COMPLETE WITH TRANSVAGINAL; Future  6. Screening for diabetes mellitus - Hemoglobin A1c  7. General counseling and advice for contraceptive management Discussed OC,depo,ring, nexplanon and IUD and she is  thinking nexplanon, she is aware of pros and cons Gave hand out on nexplanon     Plan:     Return 08/21/23 for nexplanon insertion if still desired

## 2023-08-06 LAB — INSULIN, RANDOM: INSULIN: 26.2 u[IU]/mL — ABNORMAL HIGH (ref 2.6–24.9)

## 2023-08-06 LAB — HEMOGLOBIN A1C
Est. average glucose Bld gHb Est-mCnc: 97 mg/dL
Hgb A1c MFr Bld: 5 % (ref 4.8–5.6)

## 2023-08-06 LAB — TSH+FREE T4
Free T4: 1.38 ng/dL (ref 0.82–1.77)
TSH: 4.48 u[IU]/mL (ref 0.450–4.500)

## 2023-08-08 LAB — GC/CHLAMYDIA PROBE AMP
Chlamydia trachomatis, NAA: NEGATIVE
Neisseria Gonorrhoeae by PCR: NEGATIVE

## 2023-08-14 ENCOUNTER — Ambulatory Visit (HOSPITAL_COMMUNITY)
Admission: RE | Admit: 2023-08-14 | Discharge: 2023-08-14 | Disposition: A | Discharge status: Home / Self Care | Payer: Medicaid Other | Source: Ambulatory Visit | Attending: Adult Health | Admitting: Adult Health

## 2023-08-14 DIAGNOSIS — N83292 Other ovarian cyst, left side: Secondary | ICD-10-CM | POA: Diagnosis not present

## 2023-08-14 DIAGNOSIS — N946 Dysmenorrhea, unspecified: Secondary | ICD-10-CM | POA: Diagnosis not present

## 2023-08-14 DIAGNOSIS — E282 Polycystic ovarian syndrome: Secondary | ICD-10-CM | POA: Diagnosis not present

## 2023-08-21 ENCOUNTER — Ambulatory Visit (INDEPENDENT_AMBULATORY_CARE_PROVIDER_SITE_OTHER): Payer: Medicaid Other | Admitting: Adult Health

## 2023-08-21 ENCOUNTER — Encounter: Payer: Self-pay | Admitting: Adult Health

## 2023-08-21 VITALS — BP 110/70 | HR 79 | Ht 68.5 in | Wt 296.0 lb

## 2023-08-21 DIAGNOSIS — Z3202 Encounter for pregnancy test, result negative: Secondary | ICD-10-CM | POA: Diagnosis not present

## 2023-08-21 DIAGNOSIS — Z30017 Encounter for initial prescription of implantable subdermal contraceptive: Secondary | ICD-10-CM | POA: Diagnosis not present

## 2023-08-21 LAB — POCT URINE PREGNANCY: Preg Test, Ur: NEGATIVE

## 2023-08-21 MED ORDER — ETONOGESTREL 68 MG ~~LOC~~ IMPL
68.0000 mg | DRUG_IMPLANT | Freq: Once | SUBCUTANEOUS | Status: AC
Start: 2023-08-21 — End: 2023-08-21
  Administered 2023-08-21: 68 mg via SUBCUTANEOUS

## 2023-08-21 NOTE — Progress Notes (Signed)
  Subjective:     Patient ID: Stephanie Huff, female   DOB: 2001/07/27, 22 y.o.   MRN: [id number]  HPI Stephanie Huff is a 22 year old black female,single, G0P0, in for nexplanon insertion. And she wants to review [place] again.    Review of Systems For nexplanon insertion   Reviewed past medical,surgical, social and family history. Reviewed medications and allergies.  Objective:   Physical Exam BP 110/70 (BP Location: Left Arm, Patient Position: Sitting, Cuff Size: Large)   Pulse 79   Ht 5' 8.5" (1.74 m)   Wt 296 lb (134.3 kg)   LMP 08/04/2023   BMI 44.35 kg/m  UPT is negative Consent signed, time out called. Left arm cleansed with betadine, and injected with 1.5 cc 2% lidocaine and waited til numb. Nexplanon easily inserted and steri strips applied.Rod easily palpated by provider and pt. Pressure dressing applied.      Upstream - 08/21/23 1503       Pregnancy Intention Screening   Does the patient want to become pregnant in the next year? No    Does the patient's partner want to become pregnant in the next year? No    Would the patient like to discuss contraceptive options today? No      Contraception Wrap Up   Current Method Withdrawal or Other Method    End Method Hormonal Implant    Contraception Counseling Provided Yes    How was the end contraceptive method provided? Provided on site             Assessment:     1. Pregnancy examination or test, negative result - POCT urine pregnancy  2. Nexplanon insertion Placed left arm LOT [id number] Exp 2026-04  - etonogestrel (NEXPLANON) implant 68 mg     Plan:    Try to lose weight  Return 11/04/23 for pap and physical

## 2023-09-19 ENCOUNTER — Ambulatory Visit (INDEPENDENT_AMBULATORY_CARE_PROVIDER_SITE_OTHER): Payer: Medicaid Other

## 2023-09-19 ENCOUNTER — Telehealth: Payer: Medicaid Other | Admitting: Physician Assistant

## 2023-09-19 ENCOUNTER — Ambulatory Visit (HOSPITAL_COMMUNITY)
Admission: EM | Admit: 2023-09-19 | Discharge: 2023-09-19 | Disposition: A | Discharge status: Home / Self Care | Payer: Medicaid Other | Attending: Family Medicine | Admitting: Family Medicine

## 2023-09-19 ENCOUNTER — Encounter (HOSPITAL_COMMUNITY): Payer: Self-pay | Admitting: Emergency Medicine

## 2023-09-19 ENCOUNTER — Other Ambulatory Visit (HOSPITAL_BASED_OUTPATIENT_CLINIC_OR_DEPARTMENT_OTHER): Payer: Self-pay

## 2023-09-19 DIAGNOSIS — J4521 Mild intermittent asthma with (acute) exacerbation: Secondary | ICD-10-CM | POA: Insufficient documentation

## 2023-09-19 DIAGNOSIS — W208XXA Other cause of strike by thrown, projected or falling object, initial encounter: Secondary | ICD-10-CM | POA: Diagnosis not present

## 2023-09-19 DIAGNOSIS — S99922A Unspecified injury of left foot, initial encounter: Secondary | ICD-10-CM | POA: Diagnosis not present

## 2023-09-19 DIAGNOSIS — B9789 Other viral agents as the cause of diseases classified elsewhere: Secondary | ICD-10-CM | POA: Diagnosis not present

## 2023-09-19 DIAGNOSIS — J069 Acute upper respiratory infection, unspecified: Secondary | ICD-10-CM | POA: Insufficient documentation

## 2023-09-19 DIAGNOSIS — R0602 Shortness of breath: Secondary | ICD-10-CM | POA: Insufficient documentation

## 2023-09-19 DIAGNOSIS — Z1152 Encounter for screening for COVID-19: Secondary | ICD-10-CM | POA: Insufficient documentation

## 2023-09-19 DIAGNOSIS — R509 Fever, unspecified: Secondary | ICD-10-CM | POA: Diagnosis not present

## 2023-09-19 DIAGNOSIS — Z20828 Contact with and (suspected) exposure to other viral communicable diseases: Secondary | ICD-10-CM | POA: Insufficient documentation

## 2023-09-19 DIAGNOSIS — R059 Cough, unspecified: Secondary | ICD-10-CM | POA: Diagnosis not present

## 2023-09-19 DIAGNOSIS — R600 Localized edema: Secondary | ICD-10-CM | POA: Diagnosis not present

## 2023-09-19 DIAGNOSIS — R062 Wheezing: Secondary | ICD-10-CM | POA: Diagnosis not present

## 2023-09-19 DIAGNOSIS — M79672 Pain in left foot: Secondary | ICD-10-CM | POA: Insufficient documentation

## 2023-09-19 MED ORDER — PREDNISONE 20 MG PO TABS
40.0000 mg | ORAL_TABLET | Freq: Every day | ORAL | 0 refills | Status: AC
  Filled 2023-09-19: qty 10, 5d supply, fill #0

## 2023-09-19 MED ORDER — PSEUDOEPH-BROMPHEN-DM 30-2-10 MG/5ML PO SYRP
5.0000 mL | ORAL_SOLUTION | Freq: Four times a day (QID) | ORAL | 0 refills | Status: DC | PRN
Start: 2023-09-19 — End: 2023-10-01
  Filled 2023-09-19: qty 120, 6d supply, fill #0

## 2023-09-19 MED ORDER — ONDANSETRON 4 MG PO TBDP
4.0000 mg | ORAL_TABLET | Freq: Three times a day (TID) | ORAL | 0 refills | Status: DC | PRN
  Filled 2023-09-19: qty 10, 4d supply, fill #0

## 2023-09-19 MED ORDER — NAPROXEN 500 MG PO TABS
500.0000 mg | ORAL_TABLET | Freq: Two times a day (BID) | ORAL | 0 refills | Status: DC
Start: 2023-09-19 — End: 2023-09-19
  Filled 2023-09-19: qty 30, 15d supply, fill #0

## 2023-09-19 MED ORDER — ALBUTEROL SULFATE HFA 108 (90 BASE) MCG/ACT IN AERS
2.0000 | INHALATION_SPRAY | RESPIRATORY_TRACT | 0 refills | Status: AC | PRN
  Filled 2023-09-19: qty 18, 25d supply, fill #0

## 2023-09-19 MED ORDER — FLUTICASONE PROPIONATE 50 MCG/ACT NA SUSP
2.0000 | Freq: Every day | NASAL | 0 refills | Status: DC
Start: 2023-09-19 — End: 2023-10-01
  Filled 2023-09-19: qty 16, 30d supply, fill #0

## 2023-09-19 NOTE — ED Triage Notes (Signed)
Pt c/o cough, runny nose, nausea, irritable bowels, chest tightness, and wheezing that started 4 days ago.   She also had a metal container fall on left foot today. It is bruising and painful and she would like to get it checked out

## 2023-09-19 NOTE — Discharge Instructions (Addendum)
By my review the foot x-ray is normal and does not show any broken bones.  Also by my review, the chest x-ray looks clear.  The radiologist will also read your x-ray, and if their interpretation differs significantly from mine, we will call you.  Albuterol inhaler--do 2 puffs every 4 hours as needed for shortness of breath or wheezing  Take prednisone 20 mg--2 daily for 5 days Ondansetron dissolved in the mouth every 8 hours as needed for nausea or vomiting. Clear liquids(water, gatorade/pedialyte, ginger ale/sprite, chicken broth/soup) and bland things(crackers/toast, rice, potato, bananas) to eat. Avoid acidic foods like lemon/lime/orange/tomato, and avoid greasy/spicy foods.  I can see you had prescription sent from your e-visit you had earlier today.  It is okay to take the cough syrup with the medications I have sent, but since I have sent you in a prednisone for your asthma exacerbation, I would rather you not take the anti-inflammatory naproxen.   You have been swabbed for COVID, and the test will result in the next 24 hours. Our staff will call you if positive. If the COVID test is positive, you should quarantine until you are fever free for 24 hours and you are starting to feel better, and then take added precautions for the next 5 days, such as physical distancing/wearing a mask and good hand hygiene/washing.

## 2023-09-19 NOTE — Progress Notes (Signed)
E-Visit for [employer] Virus / COVID Screening  Your current symptoms could be consistent with COVID.  Please complete a Covid test either at home or check with your local pharmacy to see if they provide testing.    You have tested positive for COVID-19, meaning that you were infected with the novel coronavirus and could give the virus to others.  Most people with COVID-19 have mild illness and can recover at home without medical care. Do not leave your home, except to get medical care. Do not visit public areas and do not go to places where you are unable to wear a mask. It is important that you stay home  to take care for yourself and to help protect other people in your home and community.      Isolation Instructions:   You are to isolate at home until you have been fever free for at least 24 hours without a fever-reducing medication, and symptoms have been steadily improving for 24 hours. At that time,  you can end isolation but need to mask for an additional 5 days.  If you must be around other household members who do not have symptoms, you need to make sure that both you and the family members are masking consistently with a high-quality mask.  If you note any worsening of symptoms despite treatment, please seek an in-person evaluation ASAP. If you note any significant shortness of breath or any chest pain, please seek ER evaluation. Please do not delay care!  Go to the nearest hospital ED for assessment if fever/cough/breathlessness are severe or illness seems like a threat to life.    The following symptoms may appear 2-14 days after exposure: Fever Cough Shortness of breath or difficulty breathing Chills Repeated shaking with chills Muscle pain Headache Sore throat New loss of taste or smell Fatigue Congestion or runny nose Nausea or vomiting Diarrhea  I have prescribed the medication(s) following: prescription anti-inflammatory called Naprosyn 500 mg. Take twice daily as needed  for fever or body aches for 2 weeks and prescription for Fluticasone nasal spray 2 sprays in each nostril one time per day, and a cough syrup called Bromfed DM Take 5mL every 6 hours as needed for coughing.  You may also take acetaminophen (Tylenol) as needed for fever.  HOME CARE Only take medications as instructed by your medical team. Drink plenty of fluids and get plenty of rest. A steam or ultrasonic humidifier can help if you have congestion.  GET HELP RIGHT AWAY IF YOU HAVE EMERGENCY WARNING SIGNS.  Call 911 or proceed to your closest emergency facility if: You develop worsening high fever. Trouble breathing Bluish lips or face Persistent pain or pressure in the chest New confusion Inability to wake or stay awake You cough up blood. Your symptoms become more severe Inability to hold down food or fluids  This list is not all possible symptoms. Contact your medical provider for any symptoms that are severe or concerning to you.   Your e-visit answers were reviewed by a board certified advanced clinical practitioner to complete your personal care plan.  Depending on the condition, your plan could have included both over the counter or prescription medications.  If there is a problem, please reply once you have received a response from your provider.  Your safety is important to [place].  If you have drug allergies check your prescription carefully.    You can use MyChart to ask questions about today's visit, request a non-urgent call back, or  ask for a work or school excuse for 24 hours related to this e-Visit. If it has been greater than 24 hours you will need to follow up with your provider or enter a new e-Visit to address those concerns. You will get an e-mail in the next two days asking about your experience.  I hope that your e-visit has been valuable and will speed your recovery. Thank you for using e-visits.    I have spent 5 minutes in review of e-visit questionnaire, review  and updating patient chart, medical decision making and response to patient.   Margaretann Loveless, PA-C

## 2023-09-19 NOTE — ED Provider Notes (Addendum)
[facility]    CSN: [id number] Arrival date & time: 09/19/23  1437      History   Chief Complaint Chief Complaint  Patient presents with   Nausea   Cough   Foot Injury    HPI Stephanie Huff is a 22 y.o. female.    Cough Foot Injury Here for cough and congestion that began bothering her on September 28, 5 days ago.  She has had some subjective fever at least most of the time.  She has had a lot of nausea but no vomiting or diarrhea.  She also has been wheezing and feeling short of breath.  She does have a history of asthma.   She was exposed to someone who had bacterial pneumonia before she got sick.  Last menstrual cycle was September 22.  She is not allergic to any medication  Also she has some left distal foot pain.  This morning a metal canister fell off the kitchen counter and struck her in her distal foot.  Past Medical History:  Diagnosis Date   ADHD    Asthma     Patient Active Problem List   Diagnosis Date Noted   Nexplanon insertion 08/21/2023   Screening examination for STD (sexually transmitted disease) 08/05/2023   Pregnancy examination or test, negative result 08/05/2023   Anxiety and depression 08/05/2023   Dysmenorrhea 08/05/2023   PCO (polycystic ovaries) 08/05/2023   General counseling and advice for contraceptive management 08/05/2023   Screening for diabetes mellitus 08/05/2023    Past Surgical History:  Procedure Laterality Date   WISDOM TOOTH EXTRACTION      OB History     Gravida  0   Para  0   Term  0   Preterm  0   AB  0   Living  0      SAB  0   IAB  0   Ectopic  0   Multiple  0   Live Births  0            Home Medications    Prior to Admission medications   Medication Sig Start Date End Date Taking? Authorizing Provider  albuterol (VENTOLIN HFA) 108 (90 Base) MCG/ACT inhaler Inhale 2 puffs into the lungs every 4 (four) hours as needed for wheezing or shortness of breath. 09/19/23  Yes  Zenia Resides, MD  ondansetron (ZOFRAN-ODT) 4 MG disintegrating tablet Take 1 tablet (4 mg total) by mouth every 8 (eight) hours as needed for nausea or vomiting. 09/19/23  Yes Zenia Resides, MD  predniSONE (DELTASONE) 20 MG tablet Take 2 tablets (40 mg total) by mouth daily with breakfast for 5 days. 09/19/23 09/24/23 Yes Zenia Resides, MD  amphetamine-dextroamphetamine (ADDERALL XR) 30 MG 24 hr capsule Take by mouth. 11/01/22   [provider]  brompheniramine-pseudoephedrine-DM 30-2-10 MG/5ML syrup Take 5 mLs by mouth 4 (four) times daily as needed. 09/19/23   Margaretann Loveless, PA-C  etonogestrel (NEXPLANON) 68 MG IMPL implant 1 each by Subdermal route once.    [provider]  fluticasone (FLONASE) 50 MCG/ACT nasal spray Place 2 sprays into both nostrils daily. Patient not taking: Reported on 09/19/2023 09/19/23   Margaretann Loveless, PA-C  ibuprofen (ADVIL) 200 MG tablet Take 400-600 mg by mouth as needed.    [provider]    Family History Family History  Adopted: Yes  Problem Relation Age of Onset   Hyperthyroidism Mother    Diabetes Other  Cancer Other    Seizures Other     Social History Social History   Tobacco Use   Smoking status: Never   Smokeless tobacco: Never  Vaping Use   Vaping status: Some Days  Substance Use Topics   Alcohol use: Not Currently   Drug use: Not Currently     Allergies   Black walnut flavor, Peanut-containing drug products, and Shellfish allergy   Review of Systems Review of Systems  Respiratory:  Positive for cough.      Physical Exam Triage Vital Signs ED Triage Vitals  Encounter Vitals Group     BP 09/19/23 1502 116/82     Systolic BP Percentile --      Diastolic BP Percentile --      Pulse Rate 09/19/23 1502 88     Resp 09/19/23 1502 16     Temp 09/19/23 1502 99.5 F (37.5 C)     Temp Source 09/19/23 1502 Oral     SpO2 09/19/23 1502 96 %     Weight --      Height --      Head  Circumference --      Peak Flow --      Pain Score 09/19/23 1459 6     Pain Loc --      Pain Education --      Exclude from Growth Chart --    No data found.  Updated Vital Signs BP 116/82 (BP Location: Right Arm)   Pulse 88   Temp 99.5 F (37.5 C) (Oral)   Resp 16   LMP 09/08/2023 (Exact Date) Comment: Implant  SpO2 96%   Visual Acuity Right Eye Distance:   Left Eye Distance:   Bilateral Distance:    Right Eye Near:   Left Eye Near:    Bilateral Near:     Physical Exam Vitals reviewed.  Constitutional:      General: She is not in acute distress.    Appearance: She is not ill-appearing, toxic-appearing or diaphoretic.  HENT:     Right Ear: Tympanic membrane and ear canal normal.     Left Ear: Tympanic membrane and ear canal normal.     Nose: Congestion present.     Mouth/Throat:     Mouth: Mucous membranes are moist.     Comments: Some clear mucus draining.  Mild erythema of the soft palate, but no tonsillar hypertrophy or asymmetry. Eyes:     Extraocular Movements: Extraocular movements intact.     Conjunctiva/sclera: Conjunctivae normal.     Pupils: Pupils are equal, round, and reactive to light.  Cardiovascular:     Rate and Rhythm: Normal rate and regular rhythm.     Heart sounds: No murmur heard. Pulmonary:     Effort: No respiratory distress.     Breath sounds: No stridor. No wheezing, rhonchi or rales.     Comments: There were no wheezes at the time of exam. Musculoskeletal:     Cervical back: Neck supple.     Comments: There is some swelling and some tenderness over the distal left foot on the dorsum.  No ecchymosis visible at this time  Lymphadenopathy:     Cervical: No cervical adenopathy.  Skin:    Capillary Refill: Capillary refill takes less than 2 seconds.     Coloration: Skin is not jaundiced or pale.  Neurological:     General: No focal deficit present.     Mental Status: She is alert and oriented to person, place,  and time.  Psychiatric:         Behavior: Behavior normal.      UC Treatments / Results  Labs (all labs ordered are listed, but only abnormal results are displayed) Labs Reviewed  SARS CORONAVIRUS 2 (TAT 6-24 HRS)    EKG   Radiology No results found.  Procedures Procedures (including critical care time)  Medications Ordered in UC Medications - No data to display  Initial Impression / Assessment and Plan / UC Course  I have reviewed the triage vital signs and the nursing notes.  Pertinent labs & imaging results that were available during my care of the patient were reviewed by me and considered in my medical decision making (see chart for details).       By my review the foot x-ray is normal  Also by my review, the chest x-ray is clear.  Patient is advised of radiology overread.  Albuterol and prednisone are sent in for the asthma exacerbation and Zofran is sent in for the nausea.  COVID swab was done today.  She is past the time when the result will make a difference for whether or not she needs antiviral treatment, but I think with her asthma I think it is important she knows whether or not this is COVID causing her symptoms.   Final Clinical Impressions(s) / UC Diagnoses   Final diagnoses:  Mild intermittent asthma with acute exacerbation  Viral upper respiratory tract infection  Left foot pain     Discharge Instructions      By my review the foot x-ray is normal and does not show any broken bones.  Also by my review, the chest x-ray looks clear.  The radiologist will also read your x-ray, and if their interpretation differs significantly from mine, we will call you.  Albuterol inhaler--do 2 puffs every 4 hours as needed for shortness of breath or wheezing  Take prednisone 20 mg--2 daily for 5 days Ondansetron dissolved in the mouth every 8 hours as needed for nausea or vomiting. Clear liquids(water, gatorade/pedialyte, ginger ale/sprite, chicken broth/soup) and bland  things(crackers/toast, rice, potato, bananas) to eat. Avoid acidic foods like lemon/lime/orange/tomato, and avoid greasy/spicy foods.  I can see you had prescription sent from your e-visit you had earlier today.  It is okay to take the cough syrup with the medications I have sent, but since I have sent you in a prednisone for your asthma exacerbation, I would rather you not take the anti-inflammatory naproxen.   You have been swabbed for COVID, and the test will result in the next 24 hours. Our staff will call you if positive. If the COVID test is positive, you should quarantine until you are fever free for 24 hours and you are starting to feel better, and then take added precautions for the next 5 days, such as physical distancing/wearing a mask and good hand hygiene/washing.      ED Prescriptions     Medication Sig Dispense Auth. Provider   albuterol (VENTOLIN HFA) 108 (90 Base) MCG/ACT inhaler Inhale 2 puffs into the lungs every 4 (four) hours as needed for wheezing or shortness of breath. 1 each Zenia Resides, MD   predniSONE (DELTASONE) 20 MG tablet Take 2 tablets (40 mg total) by mouth daily with breakfast for 5 days. 10 tablet Zenia Resides, MD   ondansetron (ZOFRAN-ODT) 4 MG disintegrating tablet Take 1 tablet (4 mg total) by mouth every 8 (eight) hours as needed for nausea or vomiting. 10 tablet  Zenia Resides, MD      PDMP not reviewed this encounter.   Zenia Resides, MD 09/19/23 1555    Zenia Resides, MD 09/19/23 1556

## 2023-09-20 DIAGNOSIS — H5213 Myopia, bilateral: Secondary | ICD-10-CM | POA: Diagnosis not present

## 2023-09-20 LAB — SARS CORONAVIRUS 2 (TAT 6-24 HRS): SARS Coronavirus 2: NEGATIVE

## 2023-09-21 ENCOUNTER — Other Ambulatory Visit (HOSPITAL_BASED_OUTPATIENT_CLINIC_OR_DEPARTMENT_OTHER): Payer: Self-pay

## 2023-09-22 DIAGNOSIS — H5213 Myopia, bilateral: Secondary | ICD-10-CM | POA: Diagnosis not present

## 2023-10-01 ENCOUNTER — Ambulatory Visit (HOSPITAL_COMMUNITY)
Admission: EM | Admit: 2023-10-01 | Discharge: 2023-10-01 | Disposition: A | Discharge status: Home / Self Care | Payer: Medicaid Other | Attending: Emergency Medicine | Admitting: Emergency Medicine

## 2023-10-01 ENCOUNTER — Encounter (HOSPITAL_COMMUNITY): Payer: Self-pay | Admitting: *Deleted

## 2023-10-01 DIAGNOSIS — S01131A Puncture wound without foreign body of right eyelid and periocular area, initial encounter: Secondary | ICD-10-CM | POA: Diagnosis not present

## 2023-10-01 DIAGNOSIS — S0993XA Unspecified injury of face, initial encounter: Secondary | ICD-10-CM | POA: Diagnosis not present

## 2023-10-01 MED ORDER — MUPIROCIN 2 % EX OINT: 1.0000 | TOPICAL_OINTMENT | Freq: Two times a day (BID) | CUTANEOUS | 0 refills | Status: DC

## 2023-10-01 NOTE — Discharge Instructions (Signed)
Apply ointment twice daily Keep area clean and dry  Wash hands before touching Avoid using any peroxide or alcohol as this interferes with healing  If no change after 1 week please remove the piercing. If you get worse, come back for re-evaluation

## 2023-10-01 NOTE — ED Provider Notes (Signed)
[facility]    CSN: [id number] Arrival date & time: 10/01/23  1516      History   Chief Complaint Chief Complaint  Patient presents with   Eye Pain    HPI Stephanie Huff is a 22 y.o. female.  Here with concerns of infected piercing In July she had right eyebrow pierced. In the last week or so the area has been red and painful. Concerned for pus drainage last night She has been cleaning area with hydrogen peroxide Does not want to remove the piercing but wants to make sure there is no worsening infection. No fevers. No eye pain or swelling. No discharge from eye  Past Medical History:  Diagnosis Date   ADHD    Asthma     Patient Active Problem List   Diagnosis Date Noted   Nexplanon insertion 08/21/2023   Screening examination for STD (sexually transmitted disease) 08/05/2023   Pregnancy examination or test, negative result 08/05/2023   Anxiety and depression 08/05/2023   Dysmenorrhea 08/05/2023   PCO (polycystic ovaries) 08/05/2023   General counseling and advice for contraceptive management 08/05/2023   Screening for diabetes mellitus 08/05/2023    Past Surgical History:  Procedure Laterality Date   WISDOM TOOTH EXTRACTION      OB History     Gravida  0   Para  0   Term  0   Preterm  0   AB  0   Living  0      SAB  0   IAB  0   Ectopic  0   Multiple  0   Live Births  0            Home Medications    Prior to Admission medications   Medication Sig Start Date End Date Taking? Authorizing Provider  amphetamine-dextroamphetamine (ADDERALL XR) 30 MG 24 hr capsule Take by mouth. 11/01/22  Yes [provider]  mupirocin ointment (BACTROBAN) 2 % Apply 1 Application topically 2 (two) times daily. 10/01/23  Yes Wanita Derenzo, Lurena Joiner, PA-C  albuterol (VENTOLIN HFA) 108 (90 Base) MCG/ACT inhaler Inhale 2 puffs into the lungs every 4 (four) hours as needed for wheezing or shortness of breath. 09/19/23   Zenia Resides, MD   etonogestrel (NEXPLANON) 68 MG IMPL implant 1 each by Subdermal route once.    [provider]    Family History Family History  Adopted: Yes  Problem Relation Age of Onset   Hyperthyroidism Mother    Diabetes Other    Cancer Other    Seizures Other     Social History Social History   Tobacco Use   Smoking status: Never   Smokeless tobacco: Never  Vaping Use   Vaping status: Some Days  Substance Use Topics   Alcohol use: Not Currently   Drug use: Not Currently     Allergies   Black walnut flavor, Peanut-containing drug products, and Shellfish allergy   Review of Systems Review of Systems Per HPI  Physical Exam Triage Vital Signs ED Triage Vitals  Encounter Vitals Group     BP 10/01/23 1527 106/66     Systolic BP Percentile --      Diastolic BP Percentile --      Pulse Rate 10/01/23 1527 87     Resp 10/01/23 1527 18     Temp 10/01/23 1527 98.6 F (37 C)     Temp Source 10/01/23 1527 Oral     SpO2 10/01/23 1527 98 %  Weight --      Height --      Head Circumference --      Peak Flow --      Pain Score 10/01/23 1526 4     Pain Loc --      Pain Education --      Exclude from Growth Chart --    No data found.  Updated Vital Signs BP 106/66 (BP Location: Left Arm)   Pulse 87   Temp 98.6 F (37 C) (Oral)   Resp 18   LMP 09/08/2023 (Exact Date) Comment: Implant  SpO2 98%    Physical Exam Vitals and nursing note reviewed.  Constitutional:      Appearance: Normal appearance.  HENT:     Head:     Comments: Piercing through right eyebrow. There is no erythema, fluctuance, induration, warmth, discharge. A little tender over the central bar. No swelling or erythema of eyelids. No pain with EOM. Eyes:     General: Lids are normal. Vision grossly intact.     Extraocular Movements: Extraocular movements intact.     Right eye: Normal extraocular motion.     Left eye: Normal extraocular motion.     Conjunctiva/sclera: Conjunctivae normal.      Pupils: Pupils are equal, round, and reactive to light.  Cardiovascular:     Rate and Rhythm: Normal rate and regular rhythm.  Pulmonary:     Effort: Pulmonary effort is normal.  Skin:    General: Skin is warm and dry.  Neurological:     Mental Status: She is alert and oriented to person, place, and time.     UC Treatments / Results  Labs (all labs ordered are listed, but only abnormal results are displayed) Labs Reviewed - No data to display  EKG  Radiology No results found.  Procedures Procedures   Medications Ordered in UC Medications - No data to display  Initial Impression / Assessment and Plan / UC Course  I have reviewed the triage vital signs and the nursing notes.  Pertinent labs & imaging results that were available during my care of the patient were reviewed by me and considered in my medical decision making (see chart for details).  Discussed that I ultimately recommend removing piercing to allow area to heal. However patient would like to try other options first. This is reasonable given there is no overt infection at this time and piercing has been there for 3 months. For now I recommend trying bactroban BID, keep area clean and dry, avoid touching. Will try this for 1 week and if no change, she will remove the piercing. Also advised against peroxide. If any worsening she will return. Patient agreeable to plan  Final Clinical Impressions(s) / UC Diagnoses   Final diagnoses:  Piercing of right eyebrow  Injury of eyebrow, initial encounter     Discharge Instructions      Apply ointment twice daily Keep area clean and dry  Wash hands before touching Avoid using any peroxide or alcohol as this interferes with healing  If no change after 1 week please remove the piercing. If you get worse, come back for re-evaluation      ED Prescriptions     Medication Sig Dispense Auth. Provider   mupirocin ointment (BACTROBAN) 2 % Apply 1 Application topically  2 (two) times daily. 22 g Raeanna Soberanes, Lurena Joiner, PA-C      PDMP not reviewed this encounter.   Marlow Baars, [place] 10/01/23 1554

## 2023-10-01 NOTE — ED Triage Notes (Signed)
Pt states she had her right eye brow pierced in July and since then it has been red and painful. She is worried the area is infected. She has seen in draining.

## 2023-10-19 ENCOUNTER — Telehealth: Payer: Medicaid Other | Admitting: Family Medicine

## 2023-10-19 DIAGNOSIS — S01132D Puncture wound without foreign body of left eyelid and periocular area, subsequent encounter: Secondary | ICD-10-CM

## 2023-10-19 DIAGNOSIS — L089 Local infection of the skin and subcutaneous tissue, unspecified: Secondary | ICD-10-CM

## 2023-10-19 DIAGNOSIS — S01132A Puncture wound without foreign body of left eyelid and periocular area, initial encounter: Secondary | ICD-10-CM

## 2023-10-19 MED ORDER — SULFAMETHOXAZOLE-TRIMETHOPRIM 800-160 MG PO TABS
1.0000 | ORAL_TABLET | Freq: Two times a day (BID) | ORAL | 0 refills | Status: AC
Start: 2023-10-19 — End: 2023-10-29

## 2023-10-19 NOTE — Progress Notes (Signed)
Virtual Visit Consent   Stephanie Huff, you are scheduled for a virtual visit with a [facility] provider today. Just as with appointments in the office, your consent must be obtained to participate. Your consent will be active for this visit and any virtual visit you may have with one of our providers in the next 365 days. If you have a MyChart account, a copy of this consent can be sent to you electronically.  As this is a virtual visit, video technology does not allow for your provider to perform a traditional examination. This may limit your provider's ability to fully assess your condition. If your provider identifies any concerns that need to be evaluated in person or the need to arrange testing (such as labs, EKG, etc.), we will make arrangements to do so. Although advances in technology are sophisticated, we cannot ensure that it will always work on either your end or our end. If the connection with a video visit is poor, the visit may have to be switched to a telephone visit. With either a video or telephone visit, we are not always able to ensure that we have a secure connection.  By engaging in this virtual visit, you consent to the provision of healthcare and authorize for your insurance to be billed (if applicable) for the services provided during this visit. Depending on your insurance coverage, you may receive a charge related to this service.  I need to obtain your verbal consent now. Are you willing to proceed with your visit today? Stephanie Huff has provided verbal consent on 10/19/2023 for a virtual visit (video or telephone). Reed Pandy, [place]  Date: 10/19/2023 3:35 PM  Virtual Visit via Video Note   I, Reed Pandy, connected with  Stephanie Huff  ([id number], 09/23/2001) on 10/19/23 at  3:30 PM EDT by a video-enabled telemedicine application and verified that I am speaking with the correct person using two identifiers.  Location: Patient: Virtual Visit Location Patient:  Home Provider: Virtual Visit Location Provider: Home Office   I discussed the limitations of evaluation and management by telemedicine and the availability of in person appointments. The patient expressed understanding and agreed to proceed.    History of Present Illness: Stephanie Huff is a 22 y.o. who identifies as a female who was assigned female at birth, and is being seen today for c/o "I need an antibiotic for my eye" Pt states she has an infection in her eyebrow x 2 months.  Pt states she is having some blurry vision and has gooey stuff coming out of her eye. Pt states she has some white puss liquid that is coming out of her eyebrow. Pt denies loss of vision, Pt states the blurry vision does not occur often and she just wipes her eyes and the blurriness goes away.   HPI: HPI  Problems:  Patient Active Problem List   Diagnosis Date Noted   Nexplanon insertion 08/21/2023   Screening examination for STD (sexually transmitted disease) 08/05/2023   Pregnancy examination or test, negative result 08/05/2023   Anxiety and depression 08/05/2023   Dysmenorrhea 08/05/2023   PCO (polycystic ovaries) 08/05/2023   General counseling and advice for contraceptive management 08/05/2023   Screening for diabetes mellitus 08/05/2023    Allergies:  Allergies  Allergen Reactions   Black Walnut Flavor Anaphylaxis and Other (See Comments)    Throat swelling   Peanut-Containing Drug Products Anaphylaxis   Shellfish Allergy Anaphylaxis    Specifically shrimp   Medications:  Current Outpatient Medications:  sulfamethoxazole-trimethoprim (BACTRIM DS) 800-160 MG tablet, Take 1 tablet by mouth 2 (two) times daily for 10 days., Disp: 20 tablet, Rfl: 0   albuterol (VENTOLIN HFA) 108 (90 Base) MCG/ACT inhaler, Inhale 2 puffs into the lungs every 4 (four) hours as needed for wheezing or shortness of breath., Disp: 18 g, Rfl: 0   amphetamine-dextroamphetamine (ADDERALL XR) 30 MG 24 hr capsule, Take by  mouth., Disp: , Rfl:    etonogestrel (NEXPLANON) 68 MG IMPL implant, 1 each by Subdermal route once., Disp: , Rfl:    mupirocin ointment (BACTROBAN) 2 %, Apply 1 Application topically 2 (two) times daily., Disp: 22 g, Rfl: 0  Observations/Objective: Patient is well-developed, well-nourished in no acute distress.  Resting comfortably at home.  Head is normocephalic, atraumatic.  No labored breathing.  Speech is clear and coherent with logical content.  Patient is alert and oriented at baseline.    Assessment and Plan: 1. Piercing of left eyebrow with infection, subsequent encounter - sulfamethoxazole-trimethoprim (BACTRIM DS) 800-160 MG tablet; Take 1 tablet by mouth 2 (two) times daily for 10 days.  Dispense: 20 tablet; Refill: 0  -Start Bactrim -Advised Pt if symptoms persist or worsen, to proceed to an urgent care or emergency hospital  -Pt verbalized understanding  Follow Up Instructions: I discussed the assessment and treatment plan with the patient. The patient was provided an opportunity to ask questions and all were answered. The patient agreed with the plan and demonstrated an understanding of the instructions.  A copy of instructions were sent to the patient via MyChart unless otherwise noted below.     The patient was advised to call back or seek an in-person evaluation if the symptoms worsen or if the condition fails to improve as anticipated.    Reed Pandy, PA-C

## 2023-10-19 NOTE — Progress Notes (Signed)
Patient initially filled out an e-visit for allergies but states they have an infection on their eyebrow from a piercing.  According to the questionnaire, the symptoms started 3 weeks ago. Patient will be better served in person.  Sorry you are not feeling well. You will need a face to face visit for complicated symptoms that could represent a serious infection.?? Either see your physician or go to one of the urgent cares centers I have listed    [facility]  [facility] at [facility]  [phone]  [address]  [place], [place] [address]  [facility]gent [facility])  [phone]  [address]  [place], [place] [address]  [facility]gent [facility])  [phone]  [address]  [place], [place] [address]  [facility]gent Care at [facility]  [phone] [address]  [place], [place] [address]  [facility] Urgent Care at [facility]  [phone]  [address]  [address]  [place], [place] [address]  [facility] Urgent Care at [place]  [phone]  [address]  [place], [place] [address]  [facility] Emergency Departments  Emergency Department-[place] [facility]  [phone]  [address]  [place], [place] [address]  open 24/7/365  [facility] Emergency Department at [facility]  [phone]  [address]  [place], [place] [address]  open 24/7/365   I have spent 5 minutes in review of e-visit questionnaire, review and updating patient chart, medical decision making and response to patient.   Reed Pandy, PA-C

## 2023-10-19 NOTE — Patient Instructions (Signed)
Stephanie Huff, thank you for joining Reed Pandy, PA-C for today's virtual visit.  While this provider is not your primary care provider (PCP), if your PCP is located in our provider database this encounter information will be shared with them immediately following your visit.   A [facility] MyChart account gives you access to today's visit and all your visits, tests, and labs performed at [facility] " click here if you don't have a [facility] MyChart account or go to [email or web address]  Consent: (Patient) Stephanie Huff provided verbal consent for this virtual visit at the beginning of the encounter.  Current Medications:  Current Outpatient Medications:    sulfamethoxazole-trimethoprim (BACTRIM DS) 800-160 MG tablet, Take 1 tablet by mouth 2 (two) times daily for 10 days., Disp: 20 tablet, Rfl: 0   albuterol (VENTOLIN HFA) 108 (90 Base) MCG/ACT inhaler, Inhale 2 puffs into the lungs every 4 (four) hours as needed for wheezing or shortness of breath., Disp: 18 g, Rfl: 0   amphetamine-dextroamphetamine (ADDERALL XR) 30 MG 24 hr capsule, Take by mouth., Disp: , Rfl:    etonogestrel (NEXPLANON) 68 MG IMPL implant, 1 each by Subdermal route once., Disp: , Rfl:    mupirocin ointment (BACTROBAN) 2 %, Apply 1 Application topically 2 (two) times daily., Disp: 22 g, Rfl: 0   Medications ordered in this encounter:  Meds ordered this encounter  Medications   sulfamethoxazole-trimethoprim (BACTRIM DS) 800-160 MG tablet    Sig: Take 1 tablet by mouth 2 (two) times daily for 10 days.    Dispense:  20 tablet    Refill:  0     *If you need refills on other medications prior to your next appointment, please contact your pharmacy*  Follow-Up: Call back or seek an in-person evaluation if the symptoms worsen or if the condition fails to improve as anticipated.  [facility] [phone]  Other Instructions Cellulitis, Adult  Cellulitis is a skin infection.  The infected area is usually warm, red, swollen, and tender. It most commonly occurs on the lower body, such as the legs, feet, and toes, but this condition can occur on any part of the body. The infection can travel to the muscles, blood, and underlying tissue and become life-threatening without treatment. It is important to get medical treatment right away for this condition. What are the causes? Cellulitis is caused by bacteria. The bacteria enter through a break in the skin, such as a cut, burn, insect or animal bite, open sore, or crack. What increases the risk? This condition is more likely to occur in people who: Have a weak body's defense system (immune system). Are older than 21 years old. Have diabetes. Have a type of long-term (chronic) liver disease (cirrhosis) or kidney disease. Are obese. Have a skin condition such as: An itchy rash, such as eczema or psoriasis. A fungal rash on the feet or in skinfolds. Blistering rashes, such as shingles or chickenpox. Slow movement of blood in the veins (venous stasis). Fluid buildup below the skin (edema). Have open wounds on the skin, such as cuts, puncture wounds, burns, bites, scrapes, tattoos, piercings, or wounds from surgery. Have had radiation therapy. Use IV drugs. What are the signs or symptoms? Symptoms of this condition include: Skin that looks red, purple, or slightly darker than your usual skin color. Streaks or spots on the skin. Swollen area of the skin. Tenderness or pain when an area of the skin is touched. Warm skin. Fever or chills. Blisters. Tiredness (  fatigue). How is this diagnosed? This condition is diagnosed based on a medical history and physical exam. You may also have tests, including: Blood tests. Imaging tests. Tests on a sample of fluid taken from the wound (wound culture). How is this treated? Treatment for this condition may include: Medicines. These may include antibiotics or medicines to treat  allergies (antihistamines). Rest. Applying cold or warm wet cloths (compresses) to the skin. If the condition is severe, you may need to stay in the hospital and get antibiotics through an IV. The infection usually starts to get better within 1-2 days of treatment. Follow these instructions at home: Medicines Take over-the-counter and prescription medicines only as told by your health care provider. If you were prescribed antibiotics, take them as told by your provider. Do not stop using the antibiotic even if you start to feel better. General instructions Drink enough fluid to keep your pee (urine) pale yellow. Do not touch or rub the infected area. Raise (elevate) the infected area above the level of your heart while you are sitting or lying down. Return to your normal activities as told by your provider. Ask your provider what activities are safe for you. Apply warm or cold compresses to the affected area as told by your provider. Keep all follow-up visits. Your provider will need to make sure that a more serious infection is not developing. Contact a health care provider if: You have a fever. Your symptoms do not improve within 1-2 days of starting treatment or you develop new symptoms. Your bone or joint underneath the infected area becomes painful after the skin has healed. Your infection returns in the same area or another area. Signs of this may include: You notice a swollen bump in the infected area. Your red area gets larger, turns dark in color, or becomes more painful. Drainage increases. Pus or a bad smell develops in your infected area. You have more pain. You feel ill and have muscle aches and weakness. You develop vomiting or diarrhea that will not go away. Get help right away if: You notice red streaks coming from the infected area. You notice the skin turns purple or black and falls off. This symptom may be an emergency. Get help right away. Call 911. Do not wait to  see if the symptom will go away. Do not drive yourself to the hospital. This information is not intended to replace advice given to you by your health care provider. Make sure you discuss any questions you have with your health care provider. Document Revised: 07/31/2022 Document Reviewed: 07/31/2022 Elsevier Patient Education  2024 Elsevier Inc.    If you have been instructed to have an in-person evaluation today at a local Urgent Care facility, please use the link below. It will take you to a list of all of our available [facility], including address, phone number and hours of operation. Please do not delay care.  [facility]  If you or a family member do not have a primary care provider, use the link below to schedule a visit and establish care. When you choose a [facility] primary care physician or advanced practice provider, you gain a long-term partner in health. Find a Primary Care Provider  Learn more about 's in-office and virtual care options:  - Get Care Now

## 2023-11-04 ENCOUNTER — Ambulatory Visit: Payer: Medicaid Other | Admitting: Adult Health

## 2023-11-22 DIAGNOSIS — E282 Polycystic ovarian syndrome: Secondary | ICD-10-CM | POA: Diagnosis not present

## 2023-11-22 DIAGNOSIS — F900 Attention-deficit hyperactivity disorder, predominantly inattentive type: Secondary | ICD-10-CM | POA: Diagnosis not present

## 2023-11-22 DIAGNOSIS — R635 Abnormal weight gain: Secondary | ICD-10-CM | POA: Diagnosis not present

## 2023-11-22 DIAGNOSIS — Z6841 Body Mass Index (BMI) 40.0 and over, adult: Secondary | ICD-10-CM | POA: Diagnosis not present

## 2023-11-22 DIAGNOSIS — R5383 Other fatigue: Secondary | ICD-10-CM | POA: Diagnosis not present

## 2024-01-07 DIAGNOSIS — E559 Vitamin D deficiency, unspecified: Secondary | ICD-10-CM | POA: Diagnosis not present

## 2024-01-07 DIAGNOSIS — E282 Polycystic ovarian syndrome: Secondary | ICD-10-CM | POA: Diagnosis not present

## 2024-01-07 DIAGNOSIS — F900 Attention-deficit hyperactivity disorder, predominantly inattentive type: Secondary | ICD-10-CM | POA: Diagnosis not present

## 2024-01-07 DIAGNOSIS — Z6841 Body Mass Index (BMI) 40.0 and over, adult: Secondary | ICD-10-CM | POA: Diagnosis not present

## 2024-01-08 ENCOUNTER — Encounter: Payer: Self-pay | Admitting: Adult Health

## 2024-01-08 ENCOUNTER — Ambulatory Visit (INDEPENDENT_AMBULATORY_CARE_PROVIDER_SITE_OTHER): Payer: Medicaid Other | Admitting: Adult Health

## 2024-01-08 VITALS — BP 128/81 | HR 77 | Ht 68.5 in | Wt 302.5 lb

## 2024-01-08 DIAGNOSIS — M549 Dorsalgia, unspecified: Secondary | ICD-10-CM | POA: Insufficient documentation

## 2024-01-08 DIAGNOSIS — R140 Abdominal distension (gaseous): Secondary | ICD-10-CM

## 2024-01-08 DIAGNOSIS — R739 Hyperglycemia, unspecified: Secondary | ICD-10-CM | POA: Diagnosis not present

## 2024-01-08 DIAGNOSIS — M5489 Other dorsalgia: Secondary | ICD-10-CM

## 2024-01-08 DIAGNOSIS — E282 Polycystic ovarian syndrome: Secondary | ICD-10-CM | POA: Diagnosis not present

## 2024-01-08 DIAGNOSIS — R635 Abnormal weight gain: Secondary | ICD-10-CM

## 2024-01-08 DIAGNOSIS — Z975 Presence of (intrauterine) contraceptive device: Secondary | ICD-10-CM

## 2024-01-08 DIAGNOSIS — L659 Nonscarring hair loss, unspecified: Secondary | ICD-10-CM | POA: Insufficient documentation

## 2024-01-08 NOTE — Progress Notes (Signed)
  Subjective:     Patient ID: Stephanie Huff, female   DOB: 2001-11-20, 23 y.o.   MRN: [id number]  HPI Stephanie Huff is a 6 year old black female,single, G0P0, in complaining of bloating, weight gain, hair loss and back pain, and sugars have been elevated. She wonders if related to nexplanon or not.  PCP is Danford Bad PA   Review of Systems + bloating,  +weight gain,  +hair loss +back pain,  +sugars have been elevated.   Reviewed past medical,surgical, social and family history. Reviewed medications and allergies.  Objective:   Physical Exam BP 128/81 (BP Location: Left Arm, Patient Position: Sitting, Cuff Size: Normal)   Pulse 77   Ht 5' 8.5" (1.74 m)   Wt (!) 302 lb 8 oz (137.2 kg)   BMI 45.33 kg/m     Skin warm and dry. Lungs: clear to ausculation bilaterally. Cardiovascular: regular rate and rhythm.  Fall risk is low  Upstream - 01/08/24 1533       Pregnancy Intention Screening   Does the patient want to become pregnant in the next year? No    Does the patient's partner want to become pregnant in the next year? No      Contraception Wrap Up   Current Method Hormonal Implant    End Method Hormonal Implant    Contraception Counseling Provided Yes             Assessment:     1. Bloating (Primary) Has bloating, will get pelvic US to assess uterus and ovaries  - US PELVIC COMPLETE WITH TRANSVAGINAL; Future  2. Weight gain  3. PCO (polycystic ovaries) Hx PCO Will get [place] to assess ovaries  - US PELVIC COMPLETE WITH TRANSVAGINAL; Future  4. Hair loss   5. Elevated blood sugar She says sugars have been 120-130  6. Nexplanon in place  Placed 08/21/23  7. Other back pain, unspecified chronicity Has back pain at neck and low back near buttocks    Plan:     Return in about 3 weeks for pelvic US and in about 4 weeks for pap and physical

## 2024-01-24 ENCOUNTER — Ambulatory Visit (HOSPITAL_COMMUNITY)
Admission: RE | Admit: 2024-01-24 | Discharge: 2024-01-24 | Disposition: A | Discharge status: Home / Self Care | Payer: Medicaid Other | Source: Ambulatory Visit | Attending: Emergency Medicine | Admitting: Emergency Medicine

## 2024-01-24 ENCOUNTER — Encounter (HOSPITAL_COMMUNITY): Payer: Self-pay

## 2024-01-24 VITALS — BP 121/86 | HR 100 | Temp 98.5°F | Resp 18

## 2024-01-24 DIAGNOSIS — N3001 Acute cystitis with hematuria: Secondary | ICD-10-CM | POA: Diagnosis not present

## 2024-01-24 LAB — POCT URINALYSIS DIP (MANUAL ENTRY)
Bilirubin, UA: NEGATIVE
Glucose, UA: NEGATIVE mg/dL
Ketones, POC UA: NEGATIVE mg/dL
Nitrite, UA: POSITIVE — AB
Protein Ur, POC: NEGATIVE mg/dL
Spec Grav, UA: 1.01 (ref 1.010–1.025)
Urobilinogen, UA: 0.2 U/dL
pH, UA: 5.5 (ref 5.0–8.0)

## 2024-01-24 MED ORDER — CEPHALEXIN 500 MG PO CAPS: 500.0000 mg | ORAL_CAPSULE | Freq: Two times a day (BID) | ORAL | 0 refills | Status: AC

## 2024-01-24 NOTE — Discharge Instructions (Signed)
 Start taking Keflex  twice daily for 5 days. Your urine culture results will return in the next few days and someone will call if results are positive and require adjustment to treatment plan.

## 2024-01-24 NOTE — ED Triage Notes (Signed)
 Pt states she has lower abdominal pain, dysuria, increased urination, thirsty a lot more than normal x 1 week. She has been taking Azo last 2 days. She states her abdominal pain was so bas she almost passed out 2 days ago.

## 2024-01-24 NOTE — ED Provider Notes (Signed)
 [facility]    CSN: [id number] Arrival date & time: 01/24/24  1429      History   Chief Complaint Chief Complaint  Patient presents with   Urinary Frequency    UTI or yeast infection, need test to clarify - Entered by patient   Near Syncope   Abdominal Pain   Dysuria    HPI Stephanie Huff is a 23 y.o. female.   Patient presents with dysuria, low abdominal pain, urinary frequency x 1 week. Denies hematuria, flank pain, fever, abnormal vaginal discharge/bleeding.    Urinary Frequency Associated symptoms include abdominal pain.  Near Syncope Associated symptoms include abdominal pain.  Abdominal Pain Associated symptoms: dysuria   Associated symptoms: no chills, no diarrhea, no fever, no nausea and no vomiting   Dysuria Associated symptoms: abdominal pain   Associated symptoms: no fever, no flank pain, no nausea and no vomiting     Past Medical History:  Diagnosis Date   ADHD    Asthma    Hyperglycemia     Patient Active Problem List   Diagnosis Date Noted   Nexplanon  in place 01/08/2024   Elevated blood sugar 01/08/2024   Hair loss 01/08/2024   Weight gain 01/08/2024   Bloating 01/08/2024   Back pain 01/08/2024   Nexplanon  insertion 08/21/2023   Screening examination for STD (sexually transmitted disease) 08/05/2023   Pregnancy examination or test, negative result 08/05/2023   Anxiety and depression 08/05/2023   Dysmenorrhea 08/05/2023   PCO (polycystic ovaries) 08/05/2023   General counseling and advice for contraceptive management 08/05/2023   Screening for diabetes mellitus 08/05/2023    Past Surgical History:  Procedure Laterality Date   WISDOM TOOTH EXTRACTION      OB History     Gravida  0   Para  0   Term  0   Preterm  0   AB  0   Living  0      SAB  0   IAB  0   Ectopic  0   Multiple  0   Live Births  0            Home Medications    Prior to Admission medications   Medication Sig Start Date End  Date Taking? Authorizing Provider  amphetamine-dextroamphetamine (ADDERALL XR) 30 MG 24 hr capsule Take by mouth. 11/01/22  Yes [provider]  cephALEXin  (KEFLEX ) 500 MG capsule Take 1 capsule (500 mg total) by mouth 2 (two) times daily for 5 days. 01/24/24 01/29/24 Yes Johnie Flaming A, NP  metFORMIN (GLUCOPHAGE) 500 MG tablet Take by mouth daily with breakfast.   Yes [provider]  albuterol  (VENTOLIN  HFA) 108 (90 Base) MCG/ACT inhaler Inhale 2 puffs into the lungs every 4 (four) hours as needed for wheezing or shortness of breath. 09/19/23   Vonna Sharlet POUR, MD  etonogestrel  (NEXPLANON ) 68 MG IMPL implant 1 each by Subdermal route once.    [provider]    Family History Family History  Adopted: Yes  Problem Relation Age of Onset   Hyperthyroidism Mother    Diabetes Other    Cancer Other    Seizures Other     Social History Social History   Tobacco Use   Smoking status: Never   Smokeless tobacco: Never  Vaping Use   Vaping status: Former  Substance Use Topics   Alcohol use: Not Currently   Drug use: Not Currently     Allergies   Black walnut flavoring agent (  non-screening), Peanut-containing drug products, and Shellfish allergy   Review of Systems Review of Systems  Constitutional:  Negative for chills and fever.  Cardiovascular:  Positive for near-syncope.  Gastrointestinal:  Positive for abdominal pain. Negative for diarrhea, nausea and vomiting.  Genitourinary:  Positive for dysuria, frequency, pelvic pain and urgency. Negative for flank pain.     Physical Exam Triage Vital Signs ED Triage Vitals  Encounter Vitals Group     BP 01/24/24 1448 121/86     Systolic BP Percentile --      Diastolic BP Percentile --      Pulse Rate 01/24/24 1448 100     Resp 01/24/24 1448 18     Temp 01/24/24 1448 98.5 F (36.9 C)     Temp Source 01/24/24 1448 Oral     SpO2 01/24/24 1448 95 %     Weight --      Height --      Head  Circumference --      Peak Flow --      Pain Score 01/24/24 1447 4     Pain Loc --      Pain Education --      Exclude from Growth Chart --    No data found.  Updated Vital Signs BP 121/86 (BP Location: Right Arm)   Pulse 100   Temp 98.5 F (36.9 C) (Oral)   Resp 18   SpO2 95%   Visual Acuity Right Eye Distance:   Left Eye Distance:   Bilateral Distance:    Right Eye Near:   Left Eye Near:    Bilateral Near:     Physical Exam Vitals and nursing note reviewed.  Constitutional:      General: She is awake. She is not in acute distress.    Appearance: Normal appearance. She is well-developed and well-groomed. She is not ill-appearing.  Abdominal:     General: Abdomen is protuberant.     Palpations: Abdomen is soft.     Tenderness: There is abdominal tenderness in the suprapubic area. There is no right CVA tenderness or left CVA tenderness.  Skin:    General: Skin is warm and dry.  Neurological:     Mental Status: She is alert.  Psychiatric:        Behavior: Behavior is cooperative.      UC Treatments / Results  Labs (all labs ordered are listed, but only abnormal results are displayed) Labs Reviewed  POCT URINALYSIS DIP (MANUAL ENTRY) - Abnormal; Notable for the following components:      Result Value   Color, UA straw (*)    Blood, UA trace-intact (*)    Nitrite, UA Positive (*)    Leukocytes, UA Trace (*)    All other components within normal limits  URINE CULTURE    EKG   Radiology No results found.  Procedures Procedures (including critical care time)  Medications Ordered in UC Medications - No data to display  Initial Impression / Assessment and Plan / UC Course  I have reviewed the triage vital signs and the nursing notes.  Pertinent labs & imaging results that were available during my care of the patient were reviewed by me and considered in my medical decision making (see chart for details).     Patient presented with 1-week history of  dysuria, lower abdominal pain/pressure, and urinary frequency. Denies any other symptoms.   Upon assessment suprapubic tenderness noted. Without CVA tenderness. UA revealed nitrites, trace blood, and trace leukocytes,  will send culture.   Prescribed Keflex  for acute cystitis. Discussed follow-up and return precautions.  Final Clinical Impressions(s) / UC Diagnoses   Final diagnoses:  Acute cystitis with hematuria     Discharge Instructions      Start taking Keflex  twice daily for 5 days. Your urine culture results will return in the next few days and someone will call if results are positive and require adjustment to treatment plan.    ED Prescriptions     Medication Sig Dispense Auth. Provider   cephALEXin  (KEFLEX ) 500 MG capsule Take 1 capsule (500 mg total) by mouth 2 (two) times daily for 5 days. 10 capsule Johnie Flaming A, NP      PDMP not reviewed this encounter.   Johnie Flaming A, NP 01/24/24 1544

## 2024-01-26 LAB — URINE CULTURE

## 2024-01-28 ENCOUNTER — Other Ambulatory Visit: Payer: Self-pay

## 2024-01-28 ENCOUNTER — Emergency Department (HOSPITAL_COMMUNITY): Payer: Medicaid Other

## 2024-01-28 ENCOUNTER — Emergency Department (HOSPITAL_COMMUNITY)
Admission: EM | Admit: 2024-01-28 | Discharge: 2024-01-28 | Disposition: A | Discharge status: Home / Self Care | Payer: Medicaid Other | Attending: Emergency Medicine | Admitting: Emergency Medicine

## 2024-01-28 ENCOUNTER — Encounter (HOSPITAL_COMMUNITY): Payer: Self-pay

## 2024-01-28 DIAGNOSIS — R161 Splenomegaly, not elsewhere classified: Secondary | ICD-10-CM | POA: Diagnosis not present

## 2024-01-28 DIAGNOSIS — Z91010 Allergy to peanuts: Secondary | ICD-10-CM | POA: Insufficient documentation

## 2024-01-28 DIAGNOSIS — R9431 Abnormal electrocardiogram [ECG] [EKG]: Secondary | ICD-10-CM | POA: Diagnosis not present

## 2024-01-28 DIAGNOSIS — R509 Fever, unspecified: Secondary | ICD-10-CM | POA: Diagnosis not present

## 2024-01-28 DIAGNOSIS — R109 Unspecified abdominal pain: Secondary | ICD-10-CM | POA: Diagnosis not present

## 2024-01-28 DIAGNOSIS — R112 Nausea with vomiting, unspecified: Secondary | ICD-10-CM | POA: Insufficient documentation

## 2024-01-28 DIAGNOSIS — R197 Diarrhea, unspecified: Secondary | ICD-10-CM | POA: Diagnosis not present

## 2024-01-28 DIAGNOSIS — R1084 Generalized abdominal pain: Secondary | ICD-10-CM | POA: Insufficient documentation

## 2024-01-28 DIAGNOSIS — R42 Dizziness and giddiness: Secondary | ICD-10-CM | POA: Diagnosis not present

## 2024-01-28 LAB — COMPREHENSIVE METABOLIC PANEL
ALT: 16 U/L (ref 0–44)
AST: 15 U/L (ref 15–41)
Albumin: 3.6 g/dL (ref 3.5–5.0)
Alkaline Phosphatase: 37 U/L — ABNORMAL LOW (ref 38–126)
Anion gap: 13 (ref 5–15)
BUN: 6 mg/dL (ref 6–20)
CO2: 21 mmol/L — ABNORMAL LOW (ref 22–32)
Calcium: 8.4 mg/dL — ABNORMAL LOW (ref 8.9–10.3)
Chloride: 104 mmol/L (ref 98–111)
Creatinine, Ser: 0.75 mg/dL (ref 0.44–1.00)
GFR, Estimated: >60 mL/min (ref >60)
Glucose, Bld: 104 mg/dL — ABNORMAL HIGH (ref 70–99)
Potassium: 3.4 mmol/L — ABNORMAL LOW (ref 3.5–5.1)
Sodium: 138 mmol/L (ref 135–145)
Total Bilirubin: 0.7 mg/dL (ref 0.0–1.2)
Total Protein: 6.7 g/dL (ref 6.5–8.1)

## 2024-01-28 LAB — URINALYSIS, ROUTINE W REFLEX MICROSCOPIC
Bilirubin Urine: NEGATIVE
Glucose, UA: NEGATIVE mg/dL
Ketones, ur: NEGATIVE mg/dL
Leukocytes,Ua: NEGATIVE
Nitrite: NEGATIVE
Protein, ur: NEGATIVE mg/dL
Specific Gravity, Urine: 1.011 (ref 1.005–1.030)
pH: 6 (ref 5.0–8.0)

## 2024-01-28 LAB — CBC
HCT: 40.9 % (ref 36.0–46.0)
Hemoglobin: 14.7 g/dL (ref 12.0–15.0)
MCH: 27 pg (ref 26.0–34.0)
MCHC: 35.9 g/dL (ref 30.0–36.0)
MCV: 75 fL — ABNORMAL LOW (ref 80.0–100.0)
Platelets: 255 10*3/uL (ref 150–400)
RBC: 5.45 MIL/uL — ABNORMAL HIGH (ref 3.87–5.11)
RDW: 13 % (ref 11.5–15.5)
WBC: 9.9 10*3/uL (ref 4.0–10.5)
nRBC: 0 % (ref 0.0–0.2)

## 2024-01-28 LAB — HCG, SERUM, QUALITATIVE: Preg, Serum: NEGATIVE

## 2024-01-28 LAB — RESP PANEL BY RT-PCR (RSV, FLU A&B, COVID)  RVPGX2
Influenza A by PCR: NEGATIVE
Influenza B by PCR: NEGATIVE
Resp Syncytial Virus by PCR: NEGATIVE
SARS Coronavirus 2 by RT PCR: NEGATIVE

## 2024-01-28 LAB — CBG MONITORING, ED: Glucose-Capillary: 99 mg/dL (ref 70–99)

## 2024-01-28 LAB — LIPASE, BLOOD: Lipase: 22 U/L (ref 11–51)

## 2024-01-28 MED ORDER — SODIUM CHLORIDE 0.9 % IV BOLUS
1000.0000 mL | Freq: Once | INTRAVENOUS | Status: AC
  Administered 2024-01-28: 1000 mL via INTRAVENOUS

## 2024-01-28 MED ORDER — IOHEXOL 350 MG/ML SOLN
75.0000 mL | Freq: Once | INTRAVENOUS | Status: AC | PRN
  Administered 2024-01-28: 75 mL via INTRAVENOUS

## 2024-01-28 MED ORDER — ONDANSETRON 4 MG PO TBDP: 4.0000 mg | ORAL_TABLET | Freq: Three times a day (TID) | ORAL | 0 refills | Status: AC | PRN

## 2024-01-28 MED ORDER — MORPHINE SULFATE (PF) 4 MG/ML IV SOLN
4.0000 mg | Freq: Once | INTRAVENOUS | Status: AC
  Administered 2024-01-28: 4 mg via INTRAVENOUS
  Filled 2024-01-28: qty 1

## 2024-01-28 MED ORDER — ONDANSETRON HCL 4 MG/2ML IJ SOLN
4.0000 mg | Freq: Once | INTRAMUSCULAR | Status: AC
  Administered 2024-01-28: 4 mg via INTRAVENOUS
  Filled 2024-01-28: qty 2

## 2024-01-28 NOTE — ED Provider Notes (Signed)
[place] EMERGENCY DEPARTMENT AT [facility] Provider Note   CSN: [id number] Arrival date & time: 01/28/24  1524     History  Chief Complaint  Patient presents with   Dizziness    Stephanie Huff is a 23 y.o. female here for evaluation of feeling unwell.  States 1 week ago she was diagnosed with a UTI.  Started on Keflex.  About 48 hours after the antibiotic she started developing nausea, vomiting, diarrhea, diffuse right sided abdominal pain.  Feels like she has had a fever however has not taken her temperature.  No cough, congestion, shortness of breath or sick contacts.  No blood in stool.  Pain to right flank as well as right upper and lower abdomen.  Not worse with food intake.  States her urine culture was contaminated and if she was still having symptoms she needed a recollection. No swelling to lower extremities.  No history of PE or DVT.  HPI     Home Medications Prior to Admission medications   Medication Sig Start Date End Date Taking? Authorizing Provider  ondansetron (ZOFRAN-ODT) 4 MG disintegrating tablet Take 1 tablet (4 mg total) by mouth every 8 (eight) hours as needed. 01/28/24  Yes Zamiah Tollett A, PA-C  albuterol (VENTOLIN HFA) 108 (90 Base) MCG/ACT inhaler Inhale 2 puffs into the lungs every 4 (four) hours as needed for wheezing or shortness of breath. 09/19/23   Zenia Resides, MD  amphetamine-dextroamphetamine (ADDERALL XR) 30 MG 24 hr capsule Take by mouth. 11/01/22   [provider]  cephALEXin (KEFLEX) 500 MG capsule Take 1 capsule (500 mg total) by mouth 2 (two) times daily for 5 days. 01/24/24 01/29/24  Wynonia Lawman A, NP  etonogestrel (NEXPLANON) 68 MG IMPL implant 1 each by Subdermal route once.    [provider]  metFORMIN (GLUCOPHAGE) 500 MG tablet Take by mouth daily with breakfast.    [provider]      Allergies    Black walnut flavoring agent (non-screening), Peanut-containing drug products, and  Shellfish allergy    Review of Systems   Review of Systems  Constitutional:  Positive for activity change, appetite change, chills and fever (subjective).  HENT: Negative.    Respiratory: Negative.    Cardiovascular: Negative.   Gastrointestinal:  Positive for abdominal pain, diarrhea, nausea and vomiting. Negative for abdominal distention, anal bleeding, blood in stool, constipation and rectal pain.  Musculoskeletal: Negative.   Skin: Negative.   Neurological:  Positive for weakness (generalized). Negative for dizziness, tremors, seizures, syncope, facial asymmetry, speech difficulty, light-headedness, numbness and headaches.  All other systems reviewed and are negative.  Physical Exam Updated Vital Signs BP 136/74   Pulse 88   Temp 98.1 F (36.7 C) (Oral)   Resp 17   Ht 5' 8.5" (1.74 m)   Wt (!) 137 kg   SpO2 98%   BMI 45.25 kg/m  Physical Exam Vitals and nursing note reviewed.  Constitutional:      General: She is not in acute distress.    Appearance: She is well-developed. She is not ill-appearing.  HENT:     Head: Normocephalic and atraumatic.     Nose: Nose normal.     Mouth/Throat:     Mouth: Mucous membranes are moist.  Eyes:     Pupils: Pupils are equal, round, and reactive to light.  Cardiovascular:     Rate and Rhythm: Normal rate.     Pulses: Normal pulses.     Heart sounds:  Normal heart sounds.  Pulmonary:     Effort: Pulmonary effort is normal. No respiratory distress.     Breath sounds: Normal breath sounds.  Abdominal:     General: Bowel sounds are normal. There is no distension.     Palpations: Abdomen is soft. There is no mass.     Tenderness: There is abdominal tenderness. There is right CVA tenderness. There is no left CVA tenderness, guarding or rebound.     Hernia: No hernia is present.     Comments: Diffuse tenderness to right abdomen and right flank.  No overlying skin changes  Musculoskeletal:        General: No swelling, tenderness,  deformity or signs of injury. Normal range of motion.     Cervical back: Normal range of motion.     Right lower leg: No edema.     Left lower leg: No edema.  Skin:    General: Skin is warm and dry.     Capillary Refill: Capillary refill takes less than 2 seconds.  Neurological:     General: No focal deficit present.     Mental Status: She is alert.     Motor: No weakness.     Gait: Gait normal.  Psychiatric:        Mood and Affect: Mood normal.     ED Results / Procedures / Treatments   Labs (all labs ordered are listed, but only abnormal results are displayed) Labs Reviewed  COMPREHENSIVE METABOLIC PANEL - Abnormal; Notable for the following components:      Result Value   Potassium 3.4 (*)    CO2 21 (*)    Glucose, Bld 104 (*)    Calcium 8.4 (*)    Alkaline Phosphatase 37 (*)    All other components within normal limits  CBC - Abnormal; Notable for the following components:   RBC 5.45 (*)    MCV 75.0 (*)    All other components within normal limits  URINALYSIS, ROUTINE W REFLEX MICROSCOPIC - Abnormal; Notable for the following components:   Hgb urine dipstick SMALL (*)    Bacteria, UA RARE (*)    All other components within normal limits  RESP PANEL BY RT-PCR (RSV, FLU A&B, COVID)  RVPGX2  URINE CULTURE  LIPASE, BLOOD  HCG, SERUM, QUALITATIVE  CBG MONITORING, ED    EKG None  Radiology CT ABDOMEN PELVIS W CONTRAST Result Date: 01/28/2024 CLINICAL DATA:  Right flank and mid abdominal pain with fever EXAM: CT ABDOMEN AND PELVIS WITH CONTRAST TECHNIQUE: Multidetector CT imaging of the abdomen and pelvis was performed using the standard protocol following bolus administration of intravenous contrast. RADIATION DOSE REDUCTION: This exam was performed according to the departmental dose-optimization program which includes automated exposure control, adjustment of the mA and/or kV according to patient size and/or use of iterative reconstruction technique. CONTRAST:  75mL  OMNIPAQUE IOHEXOL 350 MG/ML SOLN COMPARISON:  CT abdomen and pelvis dated 05/13/2022 FINDINGS: Lower chest: No focal consolidation or pulmonary nodule in the lung bases. No pleural effusion or pneumothorax demonstrated. Partially imaged heart size is normal. Hepatobiliary: No focal hepatic lesions. No intra or extrahepatic biliary ductal dilation. Normal gallbladder. Pancreas: No focal lesions or main ductal dilation. Spleen: Enlarged spleen measures 14.1 cm in AP dimension, similar. Adrenals/Urinary Tract: No adrenal nodules. No suspicious renal mass, calculi or hydronephrosis. No focal bladder wall thickening. Stomach/Bowel: Normal appearance of the stomach. No evidence of bowel wall thickening, distention, or inflammatory changes. Normal appendix. Vascular/Lymphatic: No significant  vascular findings are present. No enlarged abdominal or pelvic lymph nodes. Reproductive: No adnexal masses. Other: No free fluid, fluid collection, or free air. Musculoskeletal: No acute or abnormal lytic or blastic osseous lesions. IMPRESSION: 1. No acute abdominopelvic findings. No hydronephrosis or calculi. Normal appendix. 2. Similar splenomegaly. Electronically Signed   By: Agustin Cree M.D.   On: 01/28/2024 19:44    Procedures Procedures    Medications Ordered in ED Medications  sodium chloride 0.9 % bolus 1,000 mL (0 mLs Intravenous Stopped 01/28/24 1916)  ondansetron (ZOFRAN) injection 4 mg (4 mg Intravenous Given 01/28/24 1756)  morphine (PF) 4 MG/ML injection 4 mg (4 mg Intravenous Given 01/28/24 1755)  iohexol (OMNIPAQUE) 350 MG/ML injection 75 mL (75 mLs Intravenous Contrast Given 01/28/24 1900)   ED Course/ Medical Decision Making/ A&P   23 year old here for evaluation of right sided abdominal pain as well as nausea, vomiting and diarrhea.  Was recently seen at urgent care for dysuria.  Prescribed Keflex.  After being on Keflex for 2 days developed nausea, vomiting and diarrhea.  On arrival she is afebrile,  nonseptic, non-ill-appearing.  Urinary symptoms have resolved.  No URI symptoms.  No clinical evidence of VTE on exam.  Will plan on labs, imaging and reassess  Labs and imaging personally viewed interpreted Pregnancy test negative UA negative for infection Metabolic panel potassium 3.4 CBC without leukocytosis 9 lipase 22 Viral panel negative CT abdomen pelvis did not show an acute abnormality EKG without ischemic changes  Patient reassessed.  Symptoms improved.  Tolerating p.o. intake.  Her urine was sent for culture.  I reviewed her urine culture from urgent care visit which showed multiple species which suggested recollection.  Discussed symptomatic management.  Will have her return for new or worsening symptoms.  Patient is nontoxic, nonseptic appearing, in no apparent distress.  Patient's pain and other symptoms adequately managed in emergency department.  Fluid bolus given.  Labs, imaging and vitals reviewed.  Patient does not meet the SIRS or Sepsis criteria.  On repeat exam patient does not have a surgical abdomin and there are no peritoneal signs.  No indication of appendicitis, bowel obstruction, bowel perforation, cholecystitis, diverticulitis, PID, intermittent/persistent torsion, TOA or ectopic pregnancy.  Patient discharged home with symptomatic treatment and given strict instructions for follow-up with their primary care physician.  I have also discussed reasons to return immediately to the ER.  Patient expresses understanding and agrees with plan.                                 Medical Decision Making Amount and/or Complexity of Data Reviewed Independent Historian: friend External Data Reviewed: labs, radiology, ECG and notes. Labs: ordered. Decision-making details documented in ED Course. Radiology: ordered and independent interpretation performed. Decision-making details documented in ED Course. ECG/medicine tests: ordered and independent interpretation performed.  Decision-making details documented in ED Course.  Risk OTC drugs. Prescription drug management. Parenteral controlled substances. Decision regarding hospitalization. Diagnosis or treatment significantly limited by social determinants of health.          Final Clinical Impression(s) / ED Diagnoses Final diagnoses:  Nausea vomiting and diarrhea    Rx / DC Orders ED Discharge Orders          Ordered    ondansetron (ZOFRAN-ODT) 4 MG disintegrating tablet  Every 8 hours PRN        01/28/24 2024  Jasalyn Frysinger A, PA-C 01/28/24 2333    Linwood Dibbles, MD 01/29/24 [phone]

## 2024-01-28 NOTE — Discharge Instructions (Addendum)
It was a pleasure taking care of you here in the emergency department  We have written you for a few medications to help with your symptoms.  We have sent your urine for culture again.  Stop taking the Keflex to see if this helps with your nausea and diarrhea.

## 2024-01-28 NOTE — ED Triage Notes (Signed)
Reports was dx with UTI last week and started on antibiotics but reports went out of town and now had fever, n/v dizziness and right flank pain.  Patient is currently still on antibiotics.

## 2024-01-29 ENCOUNTER — Ambulatory Visit: Payer: Medicaid Other | Admitting: Radiology

## 2024-01-29 DIAGNOSIS — E282 Polycystic ovarian syndrome: Secondary | ICD-10-CM | POA: Diagnosis not present

## 2024-01-29 DIAGNOSIS — R140 Abdominal distension (gaseous): Secondary | ICD-10-CM | POA: Diagnosis not present

## 2024-01-29 NOTE — Progress Notes (Signed)
[place]:  TA and TV imaging obtained - Chaperone: Tish  -  vinyl probe cover used Midplane uterus, normal in size, no evidence of fibroids, symmetrical homogeneous myometrium. Em thickness = 1.6 mm  (patient on continuous BCP's) homogenous em cavity. Avascular cavity and canal.  No intracavitary soft tissue focal defects Ovaries appear normal in size. Both [place].  Normal L ov  -  Rt ovary seen with simple echofree cyst = 27 x 20 x 19 mm,  peripheral follicles seen to project into lumen of this cyst which appears to be functional physiologic process.  Multiple Follicles all appear to be placed peripherally which raises question of PCOS.  -  Neg adnexal regions  -  neg CDS  -  no free fluid present

## 2024-02-18 ENCOUNTER — Ambulatory Visit: Payer: Medicaid Other | Admitting: Adult Health

## 2024-03-09 ENCOUNTER — Telehealth (HOSPITAL_COMMUNITY): Payer: Self-pay

## 2024-03-09 NOTE — Telephone Encounter (Signed)
 PT called our office and LVM requesting to get scheduled for therapy. When I called the PT back - I confirmed date of birth and address , PT does live in [place]. I informed PT that she would have to go to our [place] location. 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 PT stated no - I went to give PT the [place] information  - PT hung up on me

## 2024-04-08 ENCOUNTER — Ambulatory Visit: Payer: Medicaid Other | Admitting: Obstetrics & Gynecology

## 2024-04-14 ENCOUNTER — Other Ambulatory Visit: Payer: Self-pay

## 2024-04-14 ENCOUNTER — Ambulatory Visit: Payer: Medicaid Other | Admitting: Obstetrics and Gynecology

## 2024-04-14 ENCOUNTER — Encounter: Payer: Self-pay | Admitting: Obstetrics and Gynecology

## 2024-04-14 VITALS — BP 134/99 | HR 78 | Wt 299.2 lb

## 2024-04-14 DIAGNOSIS — R739 Hyperglycemia, unspecified: Secondary | ICD-10-CM

## 2024-04-14 DIAGNOSIS — R635 Abnormal weight gain: Secondary | ICD-10-CM

## 2024-04-14 NOTE — Progress Notes (Signed)
    GYNECOLOGY VISIT  Patient name: Stephanie Huff MRN [id number]  Date of birth: 01/26/2001 Chief Complaint:   Contraception (Would like to know if nexplanon  causes cycle or if it should be gone), hairloss (3 months ago, would like thyroid checked. Hx of hypothyroidism. Would like to have blood work done, having hot flashes ), and Weight Management Screening (Not eating a lot and still gaining weight )   History:  Stephanie Huff is a 23 y.o. G0P0000 being seen today for concerns possibly related to nexplanon .     Nexplanon  bleeding changes and then had bleeding; FH of hypothyroidism - feels that hair has stopped growing and having idfficulty with losing weight, has been 300lb for some time and hand.s ymptoms - hot flashes, at night; hair loss and wieght gain and back pain  Completely normal menses prior to nexplanon  insertion  Does not want to get surgery  Female partner  Pills did not work, was on anti-depressants Not able to lose weight with nexplanon    Past Medical History:  Diagnosis Date   ADHD    Asthma    Hyperglycemia     Past Surgical History:  Procedure Laterality Date   WISDOM TOOTH EXTRACTION      The following portions of the patient's history were reviewed and updated as appropriate: allergies, current medications, past family history, past medical history, past social history, past surgical history and problem list.   Health Maintenance:   Last pap No results found for: "DIAGPAP", "HPVHIGH", "ADEQPAP"  High Risk HPV: Positive  Adequacy:  Satisfactory for evaluation, transformation zone component PRESENT  Diagnosis:  Atypical squamous cells of undetermined significance (ASC-US )  Last mammogram: n/a   Review of Systems:  Pertinent items are noted in HPI. Comprehensive review of systems was otherwise negative.   Objective:  Physical Exam BP (!) 134/99 (BP Location: Right Arm, Patient Position: Sitting, Cuff Size: Large)   Pulse 78   Wt 299 lb 3.2  oz (135.7 kg)   SpO2 99%   BMI 44.83 kg/m    Physical Exam Vitals and nursing note reviewed.  Constitutional:      Appearance: Normal appearance.  HENT:     Head: Normocephalic and atraumatic.  Pulmonary:     Effort: Pulmonary effort is normal.  Skin:    General: Skin is warm and dry.  Neurological:     General: No focal deficit present.     Mental Status: She is alert.  Psychiatric:        Mood and Affect: Mood normal.        Behavior: Behavior normal.        Thought Content: Thought content normal.        Judgment: Judgment normal.          Assessment & Plan:   1. Weight gain (Primary) 2. Elevated blood sugar Offered and accepted referral to medical weight loss. Reviewed that labs completed in December 2024 were mostly normal including thyroid function. Noted that insulin  was elevated and vitamin D low. Offered to recheck labs here or could wait until weight management appointment. Reviewed evidence regarding weigh with nexplanon .  - Amb Ref to Medical Weight Management    Routine preventative health maintenance measures emphasized.  Kiki Pelton, MD [facility] for [facility], [facility]

## 2024-05-07 ENCOUNTER — Ambulatory Visit: Admitting: Nutrition

## 2024-05-07 ENCOUNTER — Telehealth: Payer: Self-pay | Admitting: Nutrition

## 2024-05-07 NOTE — Telephone Encounter (Signed)
 TC for no show. R./s appt. pc

## 2024-05-26 ENCOUNTER — Ambulatory Visit: Admitting: Nutrition

## 2024-06-16 ENCOUNTER — Other Ambulatory Visit: Payer: Self-pay

## 2024-06-16 ENCOUNTER — Emergency Department (HOSPITAL_COMMUNITY): Admission: EM | Admit: 2024-06-16 | Discharge: 2024-06-16 | Disposition: A | Discharge status: Home / Self Care

## 2024-06-16 DIAGNOSIS — T7840XA Allergy, unspecified, initial encounter: Secondary | ICD-10-CM | POA: Insufficient documentation

## 2024-06-16 DIAGNOSIS — Z91010 Allergy to peanuts: Secondary | ICD-10-CM | POA: Insufficient documentation

## 2024-06-16 DIAGNOSIS — R9431 Abnormal electrocardiogram [ECG] [EKG]: Secondary | ICD-10-CM | POA: Diagnosis not present

## 2024-06-16 MED ORDER — PREDNISONE 20 MG PO TABS
60.0000 mg | ORAL_TABLET | Freq: Once | ORAL | Status: AC
  Administered 2024-06-16: 60 mg via ORAL
  Filled 2024-06-16: qty 3

## 2024-06-16 MED ORDER — FAMOTIDINE 20 MG PO TABS
20.0000 mg | ORAL_TABLET | Freq: Once | ORAL | Status: AC
  Administered 2024-06-16: 20 mg via ORAL
  Filled 2024-06-16: qty 1

## 2024-06-16 MED ORDER — PREDNISONE 10 MG PO TABS: 40.0000 mg | ORAL_TABLET | Freq: Every day | ORAL | 0 refills | Status: AC

## 2024-06-16 MED ORDER — DEXAMETHASONE SODIUM PHOSPHATE 10 MG/ML IJ SOLN
10.0000 mg | Freq: Once | INTRAMUSCULAR | Status: DC
  Filled 2024-06-16: qty 1

## 2024-06-16 MED ORDER — EPINEPHRINE 0.3 MG/0.3ML IJ SOAJ: 0.3000 mg | INTRAMUSCULAR | 0 refills | Status: AC | PRN

## 2024-06-16 MED ORDER — FAMOTIDINE 20 MG PO TABS: 20.0000 mg | ORAL_TABLET | Freq: Every day | ORAL | 0 refills | Status: AC

## 2024-06-16 MED ORDER — FAMOTIDINE IN NACL 20-0.9 MG/50ML-% IV SOLN
20.0000 mg | Freq: Once | INTRAVENOUS | Status: DC
  Filled 2024-06-16: qty 50

## 2024-06-16 NOTE — ED Provider Notes (Signed)
 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  Throat is tight, speaking in full sentences/Took benadryl)   Stephanie Huff is a 23 y.o. female.   23 year old female presents for evaluation of allergic reaction.  She states symptoms started today after she ate some trip.  Was driving and felt like her throat closed.  She pulled over.  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  She denies any nausea, rash or itching.  States she took a Benadryl and overall starting to feel improved.  Denies any other symptoms or concerns at this time.   Allergic Reaction Presenting symptoms: no rash        Prior to Admission medications   Medication Sig Start Date End Date Taking? Authorizing Provider  EPINEPHrine 0.3 mg/0.3 mL IJ SOAJ injection Inject 0.3 mg into the muscle as needed for anaphylaxis. 06/16/24  Yes Malyiah Fellows L, DO  famotidine (PEPCID) 20 MG tablet Take 1 tablet (20 mg total) by mouth daily. 06/16/24  Yes Shamya Macfadden L, DO  predniSONE  (DELTASONE ) 10 MG tablet Take 4 tablets (40 mg total) by mouth daily for 4 days. 06/16/24 06/20/24 Yes Kaitrin Seybold L, DO  albuterol  (VENTOLIN  HFA) 108 (90 Base) MCG/ACT inhaler Inhale 2 puffs into the lungs every 4 (four) hours as needed for wheezing or shortness of breath. 09/19/23   Banister, Pamela K, MD  amphetamine-dextroamphetamine (ADDERALL XR) 30 MG 24 hr capsule Take by mouth. 11/01/22   [provider]  etonogestrel  (NEXPLANON ) 68 MG IMPL implant 1 each by Subdermal route once.    [provider]  metFORMIN (GLUCOPHAGE) 500 MG tablet Take by mouth daily with breakfast.    [provider]  ondansetron  (ZOFRAN -ODT) 4 MG disintegrating tablet Take 1 tablet (4 mg total) by mouth every 8 (eight) hours as needed. Patient not  taking: Reported on 04/14/2024 01/28/24   Henderly, Britni A, PA-C    Allergies: Black walnut flavoring agent (non-screening), Peanut-containing drug products, and Shellfish allergy    Review of Systems  Constitutional:  Negative for chills and fever.  HENT:  Negative for ear pain and sore throat.   Eyes:  Negative for pain and visual disturbance.  Respiratory:  Negative for cough and shortness of breath.   Cardiovascular:  Negative for chest pain and palpitations.  Gastrointestinal:  Negative for abdominal pain and vomiting.  Genitourinary:  Negative for dysuria and hematuria.  Musculoskeletal:  Negative for arthralgias and back pain.  Skin:  Negative for color change and rash.  Neurological:  Negative for seizures and syncope.  All other systems reviewed and are negative.   Updated Vital Signs BP 121/72   Pulse 72   Temp 98.1 F (36.7 C)   Resp 13   Ht 5' 8 (1.727 m)   Wt 136.1 kg   SpO2 100%   BMI 45.61 kg/m   Physical Exam Vitals and nursing note reviewed.  Constitutional:      General: She is not in acute distress.    Appearance: She is well-developed.  HENT:     Head: Normocephalic and atraumatic.  Eyes:     Conjunctiva/sclera: Conjunctivae normal.  Cardiovascular:     Rate and Rhythm: Normal rate and regular rhythm.     Heart sounds: Normal heart sounds. No murmur heard. Pulmonary:  Effort: Pulmonary effort is normal. No respiratory distress.     Breath sounds: Normal breath sounds. No stridor. No wheezing or rhonchi.  Abdominal:     Palpations: Abdomen is soft.     Tenderness: There is no abdominal tenderness.  Musculoskeletal:        General: No swelling.     Cervical back: Neck supple.  Skin:    General: Skin is warm and dry.     Capillary Refill: Capillary refill takes less than 2 seconds.  Neurological:     Mental Status: She is alert.  Psychiatric:        Mood and Affect: Mood normal.     (all labs ordered are listed, but only abnormal  results are displayed) Labs Reviewed - No data to display  EKG: None  Radiology: No results found.   Procedures   Medications Ordered in the ED  famotidine (PEPCID) tablet 20 mg (20 mg Oral Given 06/16/24 2205)  predniSONE  (DELTASONE ) tablet 60 mg (60 mg Oral Given 06/16/24 2205)                                    Medical Decision Making Patient here for allergic reaction to shrimp.  Vital signs are stable and she has no signs of anaphylaxis.  She took Benadryl at home.  Ordered IV steroids and Pepcid but she declined which is like pills.  I will give her Pepcid and steroids and prescriptions for both of these.  She does not want to stay and be watched.  Advised Benadryl as needed.  Will also give her prescription for an EpiPen.  Advise close up with primary care doctor otherwise return to the ER for new or worsening symptoms.  She feels comfortable with the plan to be discharged home.  Problems Addressed: Allergic reaction, initial encounter: acute illness or injury  Amount and/or Complexity of Data Reviewed External Data Reviewed: notes.  Risk OTC drugs. Prescription drug management. Drug therapy requiring intensive monitoring for toxicity.    Final diagnoses:  Allergic reaction, initial encounter    ED Discharge Orders          Ordered    famotidine (PEPCID) 20 MG tablet  Daily        06/16/24 2202    predniSONE  (DELTASONE ) 10 MG tablet  Daily        06/16/24 2202    EPINEPHrine 0.3 mg/0.3 mL IJ SOAJ injection  As needed        06/16/24 2202               Gennaro Duwaine CROME, DO 06/16/24 2221

## 2024-06-16 NOTE — Discharge Instructions (Addendum)
 Take your Pepcid and steroids as prescribed.  You can use Benadryl as needed.  Follow-up with your primary care doctor.  Return to the ER for new or worsening symptoms.

## 2024-06-16 NOTE — ED Notes (Signed)
 Pt stating she is starting to feel light headed. Triage RN aware.

## 2024-12-15 ENCOUNTER — Ambulatory Visit: Payer: Self-pay

## 2024-12-27 ENCOUNTER — Inpatient Hospital Stay: Admission: RE | Admit: 2024-12-27 | Discharge: 2024-12-27 | Payer: Self-pay | Attending: Family Medicine

## 2024-12-27 VITALS — BP 123/86 | HR 66 | Temp 98.1°F | Resp 16

## 2024-12-27 DIAGNOSIS — S21001A Unspecified open wound of right breast, initial encounter: Secondary | ICD-10-CM

## 2024-12-27 NOTE — ED Provider Notes (Signed)
 " [facility]    CSN: [id number] Arrival date & time: 12/27/24  1409      History   Chief Complaint Chief Complaint  Patient presents with   Abscess    Abscess on chest - Entered by patient    HPI Stephanie Huff is a 24 y.o. female presents for possible abscess.  Patient reports 2 days ago she noticed that painful bump on her right breast near the nipple.  States it was draining some yellow discharge but she has been doing warm compresses and states it is now stopped draining and seems like it is improving.  She states it scabbed over.  No fevers but did have some chills.  No history of MRSA.  She has been doing Neosporin but does have mupirocin  at home.  No history of abscesses in the past.  No other concerns.   Abscess   Past Medical History:  Diagnosis Date   ADHD    Asthma    Hyperglycemia     Patient Active Problem List   Diagnosis Date Noted   Nexplanon  in place 01/08/2024   Elevated blood sugar 01/08/2024   Hair loss 01/08/2024   Weight gain 01/08/2024   Bloating 01/08/2024   Back pain 01/08/2024   Nexplanon  insertion 08/21/2023   Screening examination for STD (sexually transmitted disease) 08/05/2023   Pregnancy examination or test, negative result 08/05/2023   Anxiety and depression 08/05/2023   Dysmenorrhea 08/05/2023   PCO (polycystic ovaries) 08/05/2023   General counseling and advice for contraceptive management 08/05/2023   Screening for diabetes mellitus 08/05/2023    Past Surgical History:  Procedure Laterality Date   WISDOM TOOTH EXTRACTION      OB History     Gravida  0   Para  0   Term  0   Preterm  0   AB  0   Living  0      SAB  0   IAB  0   Ectopic  0   Multiple  0   Live Births  0            Home Medications    Prior to Admission medications  Medication Sig Start Date End Date Taking? Authorizing Provider  albuterol  (VENTOLIN  HFA) 108 (90 Base) MCG/ACT inhaler Inhale 2 puffs into the lungs every  4 (four) hours as needed for wheezing or shortness of breath. 09/19/23  Yes Banister, Pamela K, MD  amphetamine-dextroamphetamine (ADDERALL XR) 30 MG 24 hr capsule Take by mouth. 11/01/22  Yes [provider]  etonogestrel  (NEXPLANON ) 68 MG IMPL implant 1 each by Subdermal route once.   Yes [provider]  metFORMIN (GLUCOPHAGE) 500 MG tablet Take by mouth daily with breakfast.   Yes [provider]  EPINEPHrine  0.3 mg/0.3 mL IJ SOAJ injection Inject 0.3 mg into the muscle as needed for anaphylaxis. 06/16/24   Kammerer, Megan L, DO  famotidine  (PEPCID ) 20 MG tablet Take 1 tablet (20 mg total) by mouth daily. 06/16/24   Kammerer, Megan L, DO  ondansetron  (ZOFRAN -ODT) 4 MG disintegrating tablet Take 1 tablet (4 mg total) by mouth every 8 (eight) hours as needed. Patient not taking: Reported on 04/14/2024 01/28/24   Henderly, Britni A, PA-C    Family History Family History  Adopted: Yes  Problem Relation Age of Onset   Hyperthyroidism Mother    Diabetes Other    Cancer Other    Seizures Other     Social History Social History[1]  Allergies   Black walnut flavoring agent (non-screening), Peanut-containing drug products, and Shellfish allergy   Review of Systems Review of Systems  Skin:        Breast abscess     Physical Exam Triage Vital Signs ED Triage Vitals  Encounter Vitals Group     BP 12/27/24 1422 123/86     Girls Systolic BP Percentile --      Girls Diastolic BP Percentile --      Boys Systolic BP Percentile --      Boys Diastolic BP Percentile --      Pulse Rate 12/27/24 1422 66     Resp 12/27/24 1422 16     Temp 12/27/24 1422 98.1 F (36.7 C)     Temp Source 12/27/24 1422 Oral     SpO2 12/27/24 1422 96 %     Weight --      Height --      Head Circumference --      Peak Flow --      Pain Score 12/27/24 1420 0     Pain Loc --      Pain Education --      Exclude from Growth Chart --    No data found.  Updated Vital Signs BP 123/86  (BP Location: Left Arm)   Pulse 66   Temp 98.1 F (36.7 C) (Oral)   Resp 16   SpO2 96%   Visual Acuity Right Eye Distance:   Left Eye Distance:   Bilateral Distance:    Right Eye Near:   Left Eye Near:    Bilateral Near:     Physical Exam Vitals and nursing note reviewed. Exam conducted with a chaperone present Harlan CMA).  Constitutional:      General: She is not in acute distress.    Appearance: Normal appearance. She is not ill-appearing.  HENT:     Head: Normocephalic and atraumatic.  Eyes:     Pupils: Pupils are equal, round, and reactive to light.  Cardiovascular:     Rate and Rhythm: Normal rate.  Pulmonary:     Effort: Pulmonary effort is normal.  Chest:       Comments: There is a 0.25 scabbed nonindurated nonfluctuant wound to the right breast.  There is no drainage warmth swelling or erythema. Skin:    General: Skin is warm and dry.  Neurological:     General: No focal deficit present.     Mental Status: She is alert and oriented to person, place, and time.  Psychiatric:        Mood and Affect: Mood normal.        Behavior: Behavior normal.      UC Treatments / Results  Labs (all labs ordered are listed, but only abnormal results are displayed) Labs Reviewed - No data to display  EKG   Radiology No results found.  Procedures Procedures (including critical care time)  Medications Ordered in UC Medications - No data to display  Initial Impression / Assessment and Plan / UC Course  I have reviewed the triage vital signs and the nursing notes.  Pertinent labs & imaging results that were available during my care of the patient were reviewed by me and considered in my medical decision making (see chart for details).     Reviewed exam and symptoms with patient.  Area appears to be healing well and no signs of active infection.  Advise she can use mupirocin  to the area 3 times a day  and monitor closely and if she noticed any redness swelling  drainage fevers or chills to seek reevaluation in the ER.  PCP follow-up 2 days for recheck. Final Clinical Impressions(s) / UC Diagnoses   Final diagnoses:  Wound of right breast, initial encounter     Discharge Instructions      Start mupirocin  topical 3 times a day to the area.  Monitor closely and if you notice any worsening symptoms such as redness, drainage, fevers or chills please seek reevaluation in the emergency room.  Follow-up with your PCP in 2 days for recheck.  Hope you feel better soon!     ED Prescriptions   None    PDMP not reviewed this encounter.    [1]  Social History Tobacco Use   Smoking status: Never   Smokeless tobacco: Never  Vaping Use   Vaping status: Former  Substance Use Topics   Alcohol use: Not Currently   Drug use: Not Currently     Loreda Myla SAUNDERS, NP 12/27/24 1438  "

## 2024-12-27 NOTE — ED Triage Notes (Signed)
 Pt presents to UC for c/o abscess to right breast which started 3 days ago. Pt reports she picked the scab and it drained some pus. She's been using antibiotic ointment on it. She reports feeling feverish and nauseous last night.

## 2024-12-27 NOTE — Discharge Instructions (Addendum)
 Start mupirocin  topical 3 times a day to the area.  Monitor closely and if you notice any worsening symptoms such as redness, drainage, fevers or chills please seek reevaluation in the emergency room.  Follow-up with your PCP in 2 days for recheck.  Hope you feel better soon!
